# Patient Record
Sex: Male | Born: 1955 | Race: White | Hispanic: No | Marital: Single | Smoking: Never smoker
Health system: Southern US, Community
[De-identification: ages and names within clinical notes are randomized; demographics above are authoritative.]

## PROBLEM LIST (undated history)

## (undated) DIAGNOSIS — G4730 Sleep apnea, unspecified: Secondary | ICD-10-CM

## (undated) DIAGNOSIS — G3184 Mild cognitive impairment, so stated: Secondary | ICD-10-CM

## (undated) DIAGNOSIS — Z860100 Personal history of colon polyps, unspecified: Secondary | ICD-10-CM

## (undated) DIAGNOSIS — J300 Vasomotor rhinitis: Secondary | ICD-10-CM

## (undated) DIAGNOSIS — I823 Embolism and thrombosis of renal vein: Secondary | ICD-10-CM

## (undated) DIAGNOSIS — D126 Benign neoplasm of colon, unspecified: Secondary | ICD-10-CM

## (undated) DIAGNOSIS — M47812 Spondylosis without myelopathy or radiculopathy, cervical region: Secondary | ICD-10-CM

## (undated) DIAGNOSIS — I498 Other specified cardiac arrhythmias: Secondary | ICD-10-CM

## (undated) DIAGNOSIS — M5126 Other intervertebral disc displacement, lumbar region: Secondary | ICD-10-CM

## (undated) DIAGNOSIS — G43909 Migraine, unspecified, not intractable, without status migrainosus: Secondary | ICD-10-CM

## (undated) DIAGNOSIS — F09 Unspecified mental disorder due to known physiological condition: Secondary | ICD-10-CM

## (undated) DIAGNOSIS — E7800 Pure hypercholesterolemia, unspecified: Secondary | ICD-10-CM

## (undated) DIAGNOSIS — J309 Allergic rhinitis, unspecified: Secondary | ICD-10-CM

## (undated) DIAGNOSIS — K529 Noninfective gastroenteritis and colitis, unspecified: Secondary | ICD-10-CM

## (undated) DIAGNOSIS — L718 Other rosacea: Secondary | ICD-10-CM

## (undated) DIAGNOSIS — Z8669 Personal history of other diseases of the nervous system and sense organs: Secondary | ICD-10-CM

## (undated) DIAGNOSIS — M419 Scoliosis, unspecified: Secondary | ICD-10-CM

## (undated) DIAGNOSIS — K219 Gastro-esophageal reflux disease without esophagitis: Secondary | ICD-10-CM

## (undated) HISTORY — DX: Other intervertebral disc displacement, lumbar region: M51.26

## (undated) HISTORY — DX: Pure hypercholesterolemia, unspecified: E78.00

## (undated) HISTORY — DX: Gastro-esophageal reflux disease without esophagitis: K21.9

## (undated) HISTORY — DX: Personal history of other diseases of the nervous system and sense organs: Z86.69

## (undated) HISTORY — DX: Unspecified mental disorder due to known physiological condition: F09

## (undated) HISTORY — DX: Other rosacea: L71.8

## (undated) HISTORY — PX: ANTERIOR CRUCIATE LIGAMENT REPAIR: SHX115

## (undated) HISTORY — DX: Scoliosis, unspecified: M41.9

## (undated) HISTORY — DX: Embolism and thrombosis of renal vein: I82.3

## (undated) HISTORY — PX: ELBOW SURGERY: SHX618

## (undated) HISTORY — DX: Benign neoplasm of colon, unspecified: D12.6

## (undated) HISTORY — DX: Sleep apnea, unspecified: G47.30

## (undated) HISTORY — DX: Spondylosis without myelopathy or radiculopathy, cervical region: M47.812

## (undated) HISTORY — DX: Migraine, unspecified, not intractable, without status migrainosus: G43.909

## (undated) HISTORY — DX: Allergic rhinitis, unspecified: J30.9

## (undated) HISTORY — DX: Other specified cardiac arrhythmias: I49.8

## (undated) HISTORY — DX: Vasomotor rhinitis: J30.0

## (undated) HISTORY — DX: Noninfective gastroenteritis and colitis, unspecified: K52.9

## (undated) HISTORY — DX: Personal history of colon polyps, unspecified: Z86.0100

## (undated) HISTORY — DX: Personal history of colonic polyps: Z86.010

## (undated) HISTORY — DX: Mild cognitive impairment of uncertain or unknown etiology: G31.84

---

## 1988-09-11 HISTORY — PX: ANTERIOR CRUCIATE LIGAMENT REPAIR: SHX115

## 1988-09-11 HISTORY — PX: MOHS SURGERY: SUR867

## 1993-09-11 HISTORY — PX: OTHER SURGICAL HISTORY: SHX169

## 2000-01-17 ENCOUNTER — Ambulatory Visit (HOSPITAL_BASED_OUTPATIENT_CLINIC_OR_DEPARTMENT_OTHER): Admission: RE | Admit: 2000-01-17 | Discharge: 2000-01-17 | Payer: Self-pay | Admitting: Orthopedic Surgery

## 2000-01-17 ENCOUNTER — Encounter (INDEPENDENT_AMBULATORY_CARE_PROVIDER_SITE_OTHER): Payer: Self-pay | Admitting: *Deleted

## 2000-06-22 ENCOUNTER — Ambulatory Visit (HOSPITAL_COMMUNITY): Admission: RE | Admit: 2000-06-22 | Discharge: 2000-06-22 | Payer: Self-pay | Admitting: Gastroenterology

## 2001-06-18 ENCOUNTER — Encounter: Payer: Self-pay | Admitting: Orthopedic Surgery

## 2001-06-18 ENCOUNTER — Ambulatory Visit (HOSPITAL_COMMUNITY): Admission: RE | Admit: 2001-06-18 | Discharge: 2001-06-18 | Payer: Self-pay | Admitting: Orthopedic Surgery

## 2001-08-30 ENCOUNTER — Ambulatory Visit (HOSPITAL_COMMUNITY): Admission: RE | Admit: 2001-08-30 | Discharge: 2001-08-30 | Payer: Self-pay | Admitting: Gastroenterology

## 2004-02-02 ENCOUNTER — Encounter: Admission: RE | Admit: 2004-02-02 | Discharge: 2004-02-02 | Payer: Self-pay | Admitting: Otolaryngology

## 2005-06-30 ENCOUNTER — Ambulatory Visit (HOSPITAL_COMMUNITY): Admission: RE | Admit: 2005-06-30 | Discharge: 2005-06-30 | Payer: Self-pay | Admitting: Orthopedic Surgery

## 2008-09-11 DIAGNOSIS — I2690 Septic pulmonary embolism without acute cor pulmonale: Secondary | ICD-10-CM

## 2008-09-11 DIAGNOSIS — A0472 Enterocolitis due to Clostridium difficile, not specified as recurrent: Secondary | ICD-10-CM

## 2008-09-11 HISTORY — DX: Enterocolitis due to Clostridium difficile, not specified as recurrent: A04.72

## 2008-09-11 HISTORY — DX: Septic pulmonary embolism without acute cor pulmonale: I26.90

## 2008-12-22 ENCOUNTER — Encounter: Admission: RE | Admit: 2008-12-22 | Discharge: 2008-12-22 | Payer: Self-pay | Admitting: Family Medicine

## 2008-12-22 ENCOUNTER — Inpatient Hospital Stay (HOSPITAL_COMMUNITY): Admission: EM | Admit: 2008-12-22 | Discharge: 2008-12-25 | Payer: Self-pay | Admitting: Internal Medicine

## 2008-12-25 ENCOUNTER — Ambulatory Visit: Payer: Self-pay | Admitting: Oncology

## 2008-12-30 ENCOUNTER — Encounter: Payer: Self-pay | Admitting: Internal Medicine

## 2009-01-11 ENCOUNTER — Encounter: Payer: Self-pay | Admitting: Infectious Disease

## 2009-01-12 ENCOUNTER — Encounter: Admission: RE | Admit: 2009-01-12 | Discharge: 2009-01-12 | Payer: Self-pay | Admitting: Infectious Disease

## 2009-01-12 ENCOUNTER — Telehealth: Payer: Self-pay | Admitting: Infectious Disease

## 2009-01-12 ENCOUNTER — Ambulatory Visit: Payer: Self-pay | Admitting: Internal Medicine

## 2009-01-12 DIAGNOSIS — R0609 Other forms of dyspnea: Secondary | ICD-10-CM | POA: Insufficient documentation

## 2009-01-12 DIAGNOSIS — R0989 Other specified symptoms and signs involving the circulatory and respiratory systems: Secondary | ICD-10-CM

## 2009-01-12 DIAGNOSIS — I823 Embolism and thrombosis of renal vein: Secondary | ICD-10-CM | POA: Insufficient documentation

## 2009-01-12 DIAGNOSIS — J984 Other disorders of lung: Secondary | ICD-10-CM | POA: Insufficient documentation

## 2009-01-14 ENCOUNTER — Encounter: Payer: Self-pay | Admitting: Infectious Disease

## 2009-01-15 ENCOUNTER — Ambulatory Visit: Payer: Self-pay | Admitting: Internal Medicine

## 2009-01-15 ENCOUNTER — Ambulatory Visit: Payer: Self-pay | Admitting: Hematology and Oncology

## 2009-01-15 ENCOUNTER — Inpatient Hospital Stay (HOSPITAL_COMMUNITY): Admission: EM | Admit: 2009-01-15 | Discharge: 2009-01-16 | Payer: Self-pay | Admitting: Internal Medicine

## 2009-01-15 ENCOUNTER — Encounter: Payer: Self-pay | Admitting: Infectious Disease

## 2009-01-16 ENCOUNTER — Ambulatory Visit: Payer: Self-pay | Admitting: Internal Medicine

## 2009-01-16 ENCOUNTER — Encounter: Payer: Self-pay | Admitting: Infectious Disease

## 2009-01-18 ENCOUNTER — Encounter: Payer: Self-pay | Admitting: Infectious Disease

## 2009-01-18 LAB — CBC WITH DIFFERENTIAL/PLATELET
BASO%: 0.1 % (ref 0.0–2.0)
Basophils Absolute: 0 10*3/uL (ref 0.0–0.1)
EOS%: 0.7 % (ref 0.0–7.0)
Eosinophils Absolute: 0.2 10*3/uL (ref 0.0–0.5)
HCT: 39.4 % (ref 38.4–49.9)
HGB: 13.5 g/dL (ref 13.0–17.1)
LYMPH%: 7.9 % — ABNORMAL LOW (ref 14.0–49.0)
MCH: 30.7 pg (ref 27.2–33.4)
MCHC: 34.3 g/dL (ref 32.0–36.0)
MCV: 89.6 fL (ref 79.3–98.0)
MONO#: 1.9 10*3/uL — ABNORMAL HIGH (ref 0.1–0.9)
MONO%: 6.7 % (ref 0.0–14.0)
NEUT#: 23.7 10*3/uL — ABNORMAL HIGH (ref 1.5–6.5)
NEUT%: 84.6 % — ABNORMAL HIGH (ref 39.0–75.0)
Platelets: 248 10*3/uL (ref 140–400)
RBC: 4.4 10*6/uL (ref 4.20–5.82)
RDW: 13.5 % (ref 11.0–14.6)
WBC: 28 10*3/uL — ABNORMAL HIGH (ref 4.0–10.3)
lymph#: 2.2 10*3/uL (ref 0.9–3.3)

## 2009-01-18 LAB — MORPHOLOGY: PLT EST: ADEQUATE

## 2009-01-18 LAB — CHCC SMEAR

## 2009-01-21 ENCOUNTER — Ambulatory Visit: Payer: Self-pay | Admitting: Infectious Disease

## 2009-01-21 DIAGNOSIS — N12 Tubulo-interstitial nephritis, not specified as acute or chronic: Secondary | ICD-10-CM | POA: Insufficient documentation

## 2009-01-21 DIAGNOSIS — D702 Other drug-induced agranulocytosis: Secondary | ICD-10-CM | POA: Insufficient documentation

## 2009-01-21 DIAGNOSIS — A0472 Enterocolitis due to Clostridium difficile, not specified as recurrent: Secondary | ICD-10-CM | POA: Insufficient documentation

## 2009-01-21 DIAGNOSIS — B3781 Candidal esophagitis: Secondary | ICD-10-CM | POA: Insufficient documentation

## 2009-01-21 LAB — CBC WITH DIFFERENTIAL/PLATELET
BASO%: 0.5 % (ref 0.0–2.0)
Basophils Absolute: 0 10*3/uL (ref 0.0–0.1)
EOS%: 4.2 % (ref 0.0–7.0)
Eosinophils Absolute: 0.3 10*3/uL (ref 0.0–0.5)
HCT: 38.5 % (ref 38.4–49.9)
HGB: 13.4 g/dL (ref 13.0–17.1)
LYMPH%: 27 % (ref 14.0–49.0)
MCH: 30.9 pg (ref 27.2–33.4)
MCHC: 34.7 g/dL (ref 32.0–36.0)
MCV: 89 fL (ref 79.3–98.0)
MONO#: 0.8 10*3/uL (ref 0.1–0.9)
MONO%: 11.8 % (ref 0.0–14.0)
NEUT#: 3.8 10*3/uL (ref 1.5–6.5)
NEUT%: 56.5 % (ref 39.0–75.0)
Platelets: 192 10*3/uL (ref 140–400)
RBC: 4.33 10*6/uL (ref 4.20–5.82)
RDW: 13.3 % (ref 11.0–14.6)
WBC: 6.6 10*3/uL (ref 4.0–10.3)
lymph#: 1.8 10*3/uL (ref 0.9–3.3)

## 2009-01-21 LAB — MORPHOLOGY: PLT EST: ADEQUATE

## 2009-01-21 LAB — CHCC SMEAR

## 2009-02-04 ENCOUNTER — Ambulatory Visit: Payer: Self-pay | Admitting: Infectious Disease

## 2009-06-16 ENCOUNTER — Encounter: Admission: RE | Admit: 2009-06-16 | Discharge: 2009-06-16 | Payer: Self-pay | Admitting: Internal Medicine

## 2010-01-17 IMAGING — CT CT ANGIO CHEST
2 of 6 series · 17 of 36 positions shown · IV contrast (CONTRAST)
Comparison: Chest and abdominal CTs 12/22/2008.

CLINICAL DATA: Renal vein thrombosis with increase shortness of
breath.  Question pulmonary embolism.

CT ANGIOGRAPHY CHEST WITH CONTRAST
TECHNIQUE: Multidetector CT imaging of the chest was performed
using the standard protocol during bolus administration of
intravenous contrast. Multiplanar CT image reconstructions
including MIPs were obtained to evaluate the vascular anatomy.
Contrast: 120 ml 4mnipaque-YTT intravenously.

[Series 7: thins pacs · axial · 0.71mm/px · z∈[+1364,+1648]mm · 16 of 231 slices shown]
[im 14/231  lung]
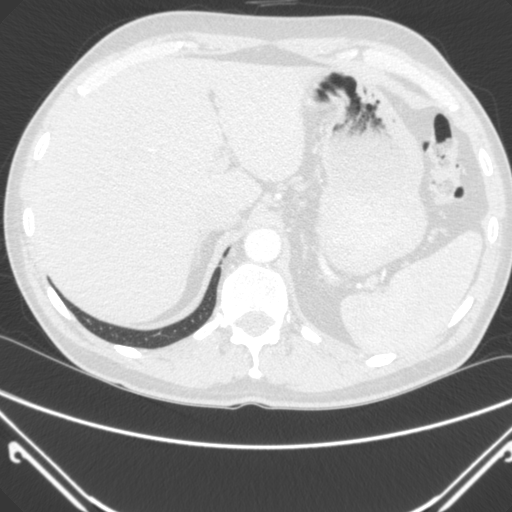
[im 28/231  mediastinal]
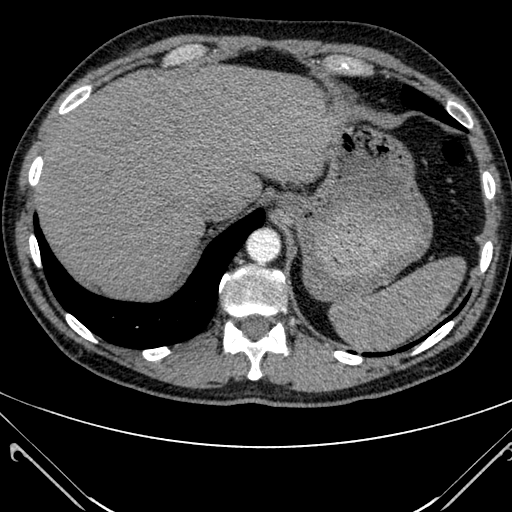
[im 41/231  lung]
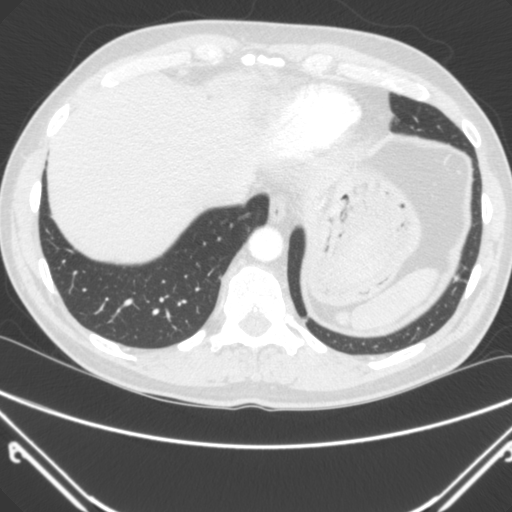
[im 55/231  mediastinal]
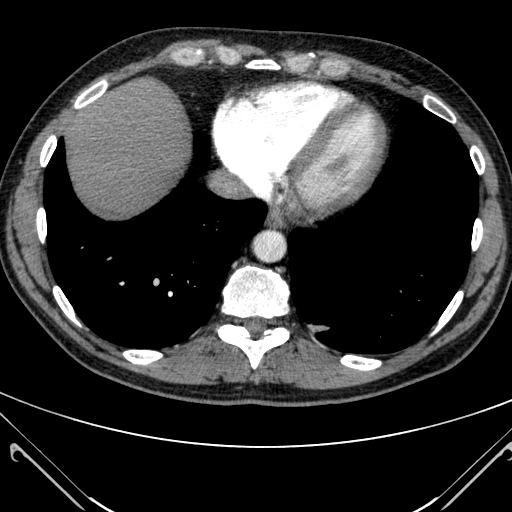
[im 68/231  lung]
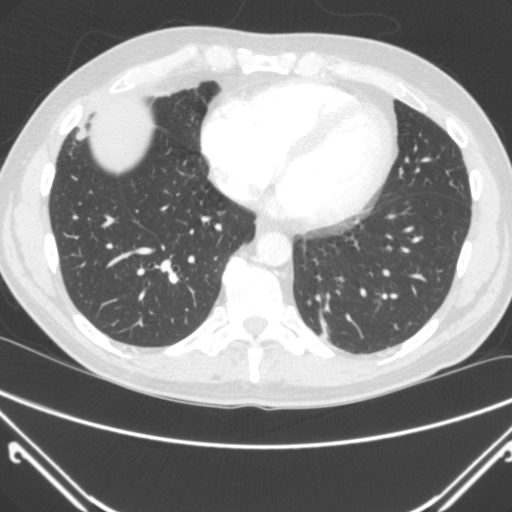
[im 82/231  mediastinal]
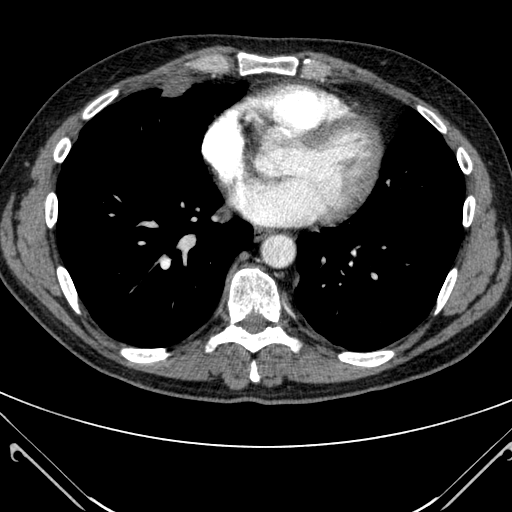
[im 95/231  lung]
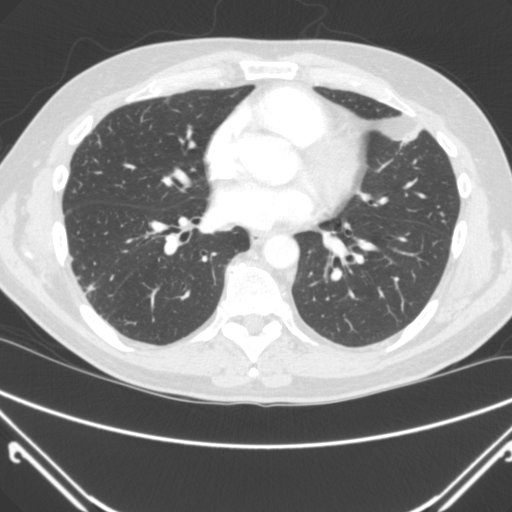
[im 109/231  mediastinal]
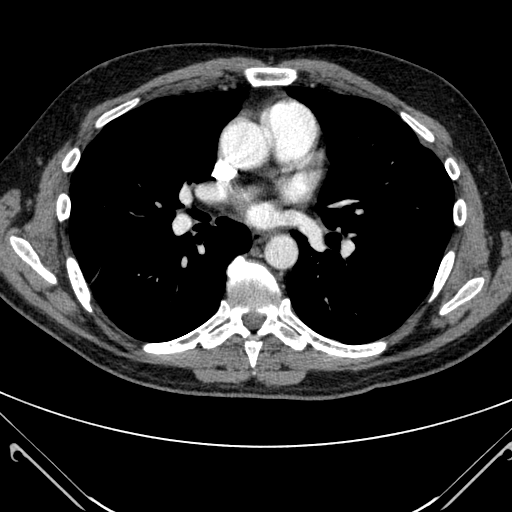
[im 122/231  lung]
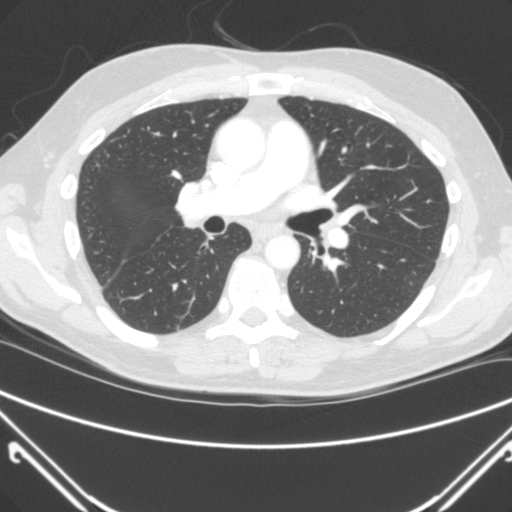
[im 136/231  mediastinal]
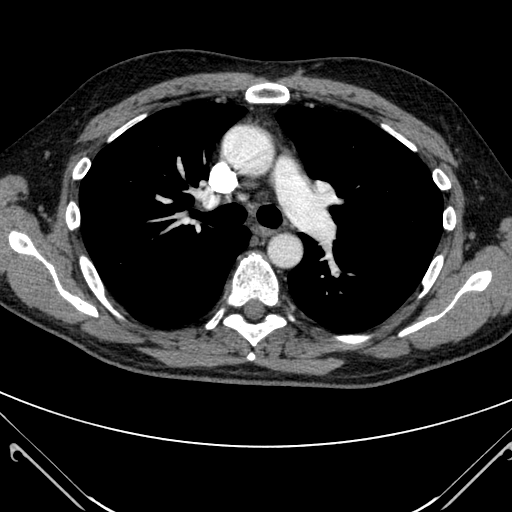
[im 149/231  lung]
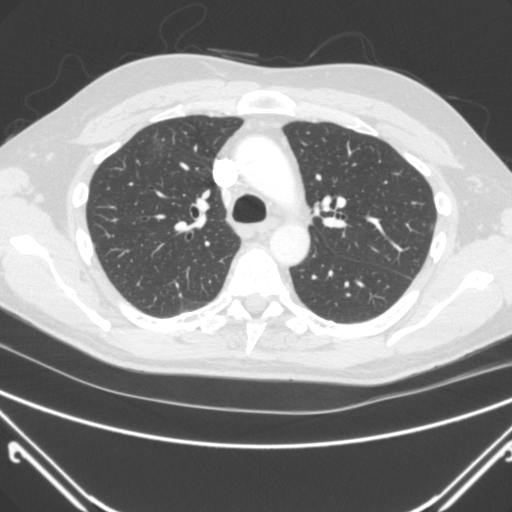
[im 163/231  mediastinal]
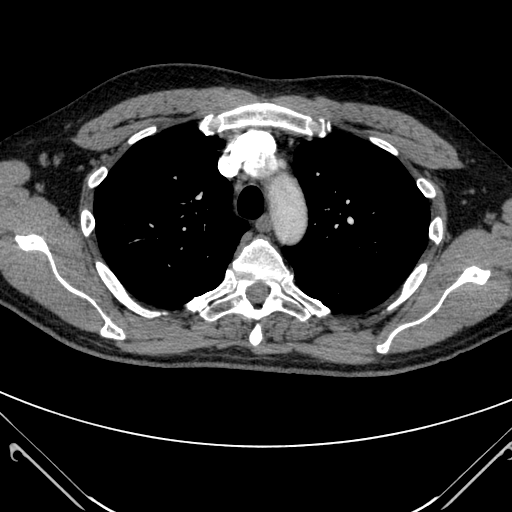
[im 176/231  lung]
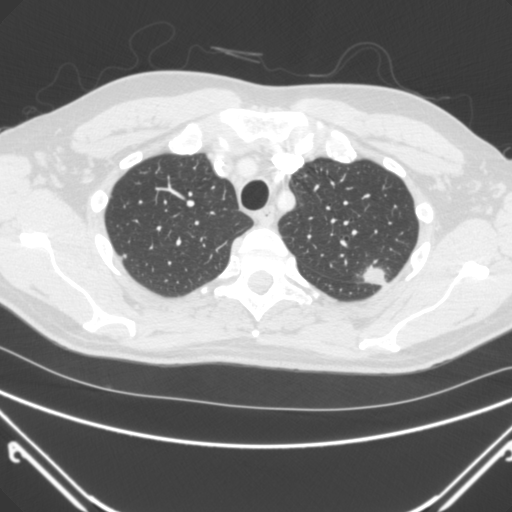
[im 190/231  mediastinal]
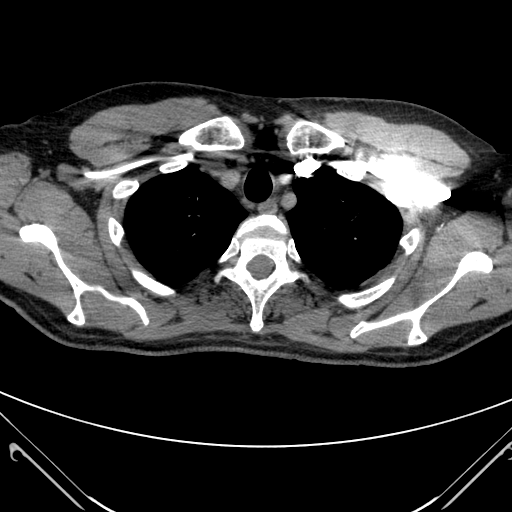
[im 203/231  lung]
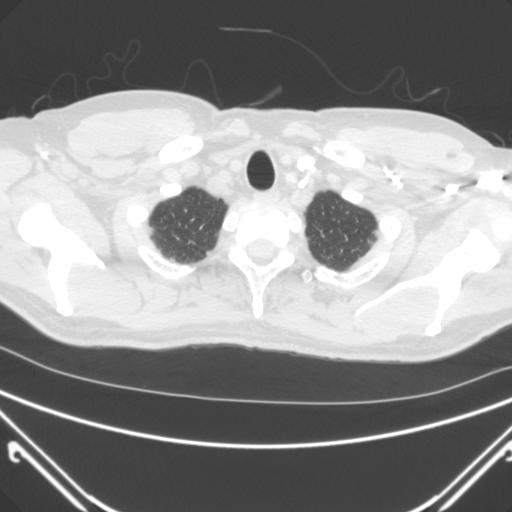
[im 217/231  mediastinal]
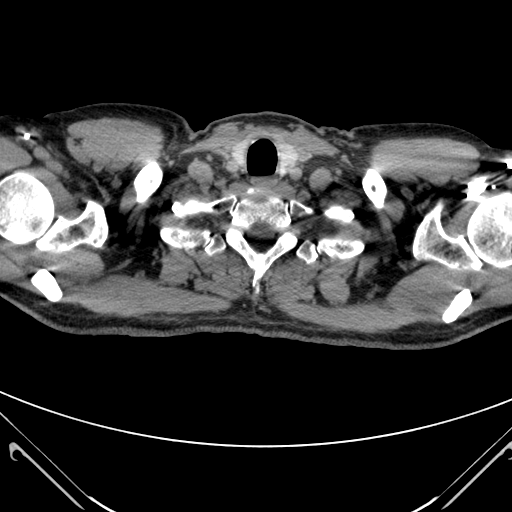

[coronals · coronal · 0.71mm/px · 1 of 154 slices shown]
[im 77/154  mediastinal]
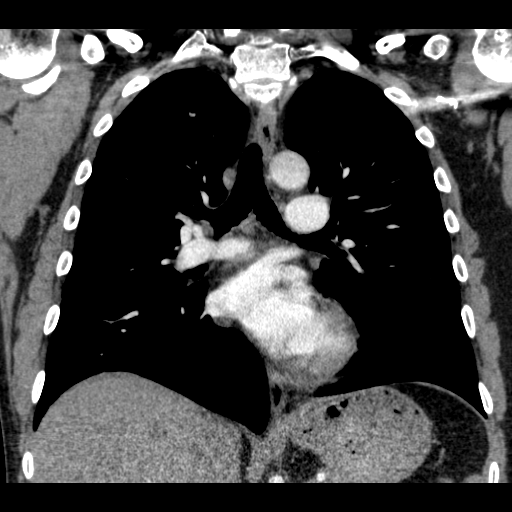

[17 of 36 positions shown; findings below may reference images not displayed]

FINDINGS: The pulmonary arteries are well opacified with contrast.
There is no evidence of acute pulmonary embolism.  The thoracic
aorta appears normal.  There are no enlarged mediastinal or hilar
lymph nodes.  There is no pleural or pericardial effusion.  Right
arm PICC extends to the SVC right atrial junction.

The multiple cavitary pulmonary nodules identified on the prior
examination have improved.  A previously identified cavitary 2.4 cm
left upper lobe nodule now measures 1.3 x 1.6 cm on image 26 and is
no longer cavitary.  A previously demonstrated 14 mm cavitary right
upper lobe nodule now measures 6 mm on image 37 and is no longer
cavitary.  A 2.6-cm lingular nodule on the prior study now measures
1.7 cm on image 61.  Right middle lobe aeration has improved.  No
enlarging pulmonary nodules are identified.

The imaged upper abdomen appears unremarkable.  The left kidney and
left renal vein are not imaged.

Review of the MIP images confirms the above findings.
IMPRESSION: 1.  No evidence of acute pulmonary embolism.
2.  Interval improvement in multiple cavitary pulmonary nodules
bilaterally compatible with treated septic emboli.
3.  No demonstrated acute findings.

The findings were reviewed with Dr. Marcelis following the
examination.  Findings were also discussed by telephone with Dr.
Sarpei Soldiers.

## 2010-10-03 ENCOUNTER — Encounter: Payer: Self-pay | Admitting: Infectious Disease

## 2010-10-11 NOTE — Progress Notes (Signed)
Summary: PTS CTA BETTER ANC  200  Phone Note Outgoing Call   Call placed by: Acey Lav MD,  Jan 12, 2009 12:19 PM Details for Reason: pt sp CTA and labs Summary of Call: CTA done today showed no PE, and shrinkageof pulmonary nodules. Unfortunately his neutropenia has worsened on the vancomycin to an anc of 200. I am dcing his vancomycin and swithcing him to linezolid. He can complete linezolid and levaquin to finish out his course of parenteral therapy and we can dispense with the IV. Initial call taken by: Acey Lav MD,  Jan 12, 2009 12:21 PM    New/Updated Medications: ZYVOX 600 MG TABS (LINEZOLID) Take 1 tablet by mouth two times a day until seen by Dr. Daiva Eves LEVAQUIN 750 MG TABS (LEVOFLOXACIN) Take 1 tablet by mouth once a day until seen by Dr. Daiva Eves PYRIDOXINE HCL 50 MG TABS (PYRIDOXINE HCL) Take 1 tablet by mouth once a day while taking the zyvox   Prescriptions: LEVAQUIN 750 MG TABS (LEVOFLOXACIN) Take 1 tablet by mouth once a day until seen by Dr. Daiva Eves  #10 x 3   Entered and Authorized by:   Acey Lav MD   Signed by:   Paulette Blanch Dam MD on 01/12/2009   Method used:   Electronically to        [id number] (retail)       [address]       [place], [place]  [address]       Ph: [phone] or [phone]       Fax: [phone]   RxID:   [id number] ZYVOX 600 MG TABS (LINEZOLID) Take 1 tablet by mouth two times a day until seen by Dr. Daiva Eves  #20 x 1   Entered and Authorized by:   Acey Lav MD   Signed by:   Paulette Blanch Dam MD on 01/12/2009   Method used:   Electronically to        [facility] (retail)       [address]       [place], [place]  [address]       Ph: [phone] or [phone]       Fax: [phone]   RxID:   [id number] PYRIDOXINE HCL 50 MG TABS (PYRIDOXINE HCL) Take 1 tablet by mouth once a day while taking the zyvox  #10 x 0   Entered and Authorized by:   Acey Lav MD   Signed by:    Paulette Blanch Dam MD on 01/12/2009   Method used:   Electronically to        [facility] (retail)       [address]       [place], [place]  [address]       Ph: [phone] or [phone]       Fax: [phone]   RxID:   [id number] LEVAQUIN 750 MG TABS (LEVOFLOXACIN) Take 1 tablet by mouth once a day until seen by Dr. Daiva Eves  #10 x 3   Entered and Authorized by:   Acey Lav MD   Signed by:   Paulette Blanch Dam MD on 01/12/2009   Method used:   Electronically to        [facility] [address] (retail)       [address]       [place],  [place]  [address]       Ph: [phone]       Fax: [phone]   RxID:   [id number] ZYVOX 600 MG TABS (LINEZOLID) Take 1 tablet by mouth two times a day until seen by Dr. Daiva Eves  #20 x 1   Entered and Authorized by:   Acey Lav MD   Signed by:   Paulette Blanch Dam MD on 01/12/2009   Method used:   Electronically to        [facility] [address] (retail)       [address]       [place], [place]  [address]       Ph: [phone]       Fax: [phone]   RxID:   [id number]

## 2010-10-11 NOTE — Miscellaneous (Signed)
Summary: [facility]: Orders  [facility]: Orders   Imported By: Florinda Marker 01/26/2009 14:25:30  _____________________________________________________________________  External Attachment:    Type:   Image     Comment:   External Document

## 2010-10-11 NOTE — Miscellaneous (Signed)
Summary: HIPAA Restrictions  HIPAA Restrictions   Imported By: Florinda Marker 01/21/2009 14:21:14  _____________________________________________________________________  External Attachment:    Type:   Image     Comment:   External Document

## 2010-10-11 NOTE — Assessment & Plan Note (Signed)
Summary: hsfu need chrt/ok per kvd/kam   Primary Provider:  Dr. Mallie Mussel  CC:  f/u ov 2. esophagitis pain started last week after leukopenia 3. most recent d/c Saturday .  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 Please see my consult note from that admission for details. Briefly he developed fevers, chills on Easter Sunday, did urine dipstick whcih was positive for nitrites, LE, (but did not do culture), self rx levaquin 750mg , continue to feel unwell, saw Dr.McDiarmid who did scan showing pyelonephritis. Dr. Levonne Lapping got urine culture (which grew nothing but on levaquin) gave pt IM rocephin, gentamicin x 3 days and continued Levaquin. By the following weekend he had continued fevers, now DOE. He had CXR with mx pulmonary nodules. CT chest abd, pelvis showed mutiple cavitary nodules throughout lungs c/w emboli, he also had renal vein thrombosis. He had blood cultures drawn (still on levaquin), TEE was done which was clean. He was ruled out for TB, crypto ag, neg, HIV negative. He had been started on vanco and rocephin and was dc to home. In the interim he again felt unwell with malaise, dyspnea, repeat CT scan  showed nodules regressing in size. In the interim he then developed neutropneia, and rocepihn was stopped, and levaquin subtituted, neutropenia persisted and vanoc seemed likely culprit, and I swapped zyvox in for vancomycin. Neutropenia persisted still and there was smear concerning for blasts. HE was admitted to [facility] over weekend and see by Dr. Dalene Carrow and given several doses of GCSF. He developed diarrhea and cramping abdominal pain and was started on empiric flagyl. IN the interim he also had developed pain with swallowing with concern for candidal esophagiti and started clotrimazole troches. He apparently had repeat labs this MOnday and now has leukocytosis (due to GCSF plus/minus possible C diff--C diff toxin x 1 negative though this is not very good test)>  TOday he feels better, still with loose bm, up to 4 a day but less than he had been having and crmaping is gone. His dyspnea is much better. No fevers now.  Preventive Screening-Counseling & Management     Alcohol drinks/day: 0     Smoking Status: never     Caffeine use/day: 2/wk   Current Allergies (reviewed today): ! 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 c diff    possible candida esophagitis.  Family History:      No history of nephrotic syndrome or hypercoagulable      disorder.         Social History:    The patient is single, lives alone.  He has several      dogs.  None of them have licked any wounds or anything on his body      recently.  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  He has traveled along      the [address] and has once been to [facility] park but      otherwise has not been to the [facility].  He has not been outside      of the country.  His annual PPD test in March was nonreactive.  He  denies recreational drug use including intravenous drug use.  He denies      any risk factors for HIV, although he is willing to be tested for HIV.         Review of Systems       The patient complains of weight loss, dyspnea on exertion, and abdominal pain.  The patient denies anorexia, fever, decreased hearing, hoarseness, chest pain, syncope, peripheral edema, headaches, hemoptysis, melena, and hematochezia.    Vital Signs:  Patient profile:   55 year old male Height:      74 inches (187.96 cm) Weight:      204.1 pounds (44.73 kg) BMI:     26.30 BSA:     2.19 Temp:     97.6 degrees F Pulse rate:   63 / minute BP sitting:   126 / 84  Vitals Entered By:  Tomasita Morrow RN (Jan 21, 2009 9:16 AM) CC: f/u ov 2. esophagitis pain started last week after leukopenia 3. most recent d/c Saturday  Is Patient Diabetic? No Pain Assessment Patient in pain? no      Nutritional Status BMI of 19 -24 = normal Nutritional Status Detail normal   Have you ever been in a relationship where you felt threatened, hurt or afraid?No  Domestic Violence Intervention none  Does patient need assistance? Functional Status Self care Ambulation Normal   Physical Exam  General:  alert and well-developed Head:  normocephalic and atraumatic.   Eyes:  vision grossly intact and pupils equal.   Ears:  no external deformities.   Nose:  no external deformity.   Mouth:  good dentition and pharynx pink and moist.  no thrush visible Lungs:  normal respiratory effort, normal breath sounds, and no crackles.   Heart:  normal rate, regular rhythm, no murmur, no gallop, and no rub.   Abdomen:  soft, non-tender, normal bowel sounds, and no distention.   Msk:  normal ROM.   Extremities:  No clubbing, cyanosis, edema, or deformity noted with normal full range of motion of all joints.   Neurologic:  alert & oriented X3.     Impression & Recommendations:  Problem # 1:  OTHER DISEASES OF LUNG NOT ELSEWHERE CLASSIFIED (ICD-518.89) mx pulmonary emboli, septic vs aseptic due to Renal vein thrombosis. Stop his linezolid and levaquin today as he is now 29 days of antibacterial therapy and not neutropenic. Repeat blood cultures in 2 weeks to make sure if he had bacteremia that it has been cleared Orders: Est. Patient Level IV (99214)Future Orders: T-Culture, Blood Routine ([id number]) ... 02/04/2009 T-Culture, Blood Routine ([id number]) ... 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 Cyndie Chime as well. Workup for cause, at this point points to the pyelonephritis as the cause. ON coumadin. Would likely treat for a year  with anticoagulation.  Will repeat Ct of the abdomen in one more  month.  Orders: Est. Patient Level IV (99214)Future Orders: CT with Contrast (CT w/ contrast) ... 02/22/2009  Problem # 3:  PYELONEPHRITIS (ICD-590.80) Assessment: Improved  resolved, was cause of RVT it appears The following medications were removed from the medication list:    Zyvox 600 Mg Tabs (Linezolid) .Marland Kitchen... Take 1 tablet by mouth two times a day until seen by dr. Zenaida Niece dam    Levaquin 750 Mg Tabs (Levofloxacin) .Marland Kitchen... Take 1 tablet by mouth once a day until seen by dr. Zenaida Niece dam His updated medication list for this problem includes:  Metronidazole 500 Mg Tabs (Metronidazole) ..... One three times a day  Orders: Est. Patient Level IV (19147)  Problem # 4:  NEUTROPENIA, DRUG-INDUCED (ICD-288.03) Assessment: Improved  due to vancomycin it appears. He is following up with Dr. Cyndie Chime. He is now with high wbc secondary to gcsf and possibly cdiff  Orders: Est. Patient Level IV (82956)  Problem # 5:  CANDIDIASIS OF THE ESOPHAGUS (OZH-086.57) Assessment: New  Will give him 2 weeks of therapy with fluconazole  Orders: Est. 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 He is to finish 14 day course of flagyl.   Orders: Est. Patient Level IV (29528)  Medications Added to Medication List This Visit: 1)  Metronidazole 500 Mg Tabs (Metronidazole) .... One three times a day 2)  Coumadin 7.5 Mg Tabs (Warfarin sodium) .... Current daily dose 3)  Mycelex 10 Mg Troc (Clotrimazole) .... Once daily started 01-20-09 4)  Diflucan 100 Mg Tabs (Fluconazole) .... Take two tablets on day one then take 1 tablet by mouth once a day for 13 days  Patient Instructions: 1)  Please come back for blood cultures in 2 weeks (or do this at cancer center) 2)  We will schedule Ct of abdomen in another month 3)  Follouwp with Dr Daiva Eves as  needed Prescriptions: DIFLUCAN 100 MG TABS (FLUCONAZOLE) take two tablets on day one then Take 1 tablet by mouth once a day for 13 days  #15 x 3   Entered and Authorized by:   Acey Lav MD   Signed by:   Paulette Blanch Dam MD on 01/21/2009   Method used:   Print then Give to Patient   RxID:   [id number] DIFLUCAN 100 MG TABS (FLUCONAZOLE) take two tablets on day one then Take 1 tablet by mouth once a day for 13 days  #15 x 1   Entered and Authorized by:   Acey Lav MD   Signed by:   Paulette Blanch Dam MD on 01/21/2009   Method used:   Electronically to        [facility] [address] (retail)       [address]       [place], [place]  [address]       Ph: [phone]       Fax: [phone]   RxID:   [id number]

## 2010-10-11 NOTE — Letter (Signed)
Summary: Telephone Note/[facility]  Telephone Note/[facility]   Imported By: Sherian Rein 01/22/2009 13:25:29  _____________________________________________________________________  External Attachment:    Type:   Image     Comment:   External Document

## 2010-10-11 NOTE — Progress Notes (Signed)
Summary: PT NEEDS CT TODAY  Phone Note Call from Patient   Caller: pt dyspnic Summary of Call: Pt with dypsnea. He was becomeing neutropenic last week while on the rocehpine and vancomycin. I changed him to levaquin and vancomcyinc. 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

## 2010-10-11 NOTE — Assessment & Plan Note (Signed)
Summary: Pulmonary/ trial of symbicort 80 for sob   Primary Provider/Referring Provider:  Dr. Mallie Mussel  CC:  Post hospital followup.  Pt c/o increased sob on exertion.  States that this tends to be worse by the end of the day.  Also has some sry cough..  History of Present Illness: 49 yowm never smoker onset llq pain Easter 2010,  tntc rbc's so wu for kidney stones turned up pyelonephritis on left and rx with roc/gent/levaquin > worsening  pleuritic cp bilateral with sob so admit [facility]  4/13 - 12/25/08 with cavitary nodules.  rx as pyelo/rv thrombosis > Septic emboli with neg TEE for veg and ct fu done Jan 12, 2009 showed improvment.  Jan 12, 2009 ov with cc sob after working all day, assoc with dry cough,  no worse sleeping, sob better today afer neb . Pt denies any significant sore throat, nasal congestion or excess secretions, fever, chills, sweats, unintended wt loss, pleuritic or exertional cp, orthopnea pnd or leg swelling.    Current Medications (verified): 1)  Zyvox 600 Mg Tabs (Linezolid) .... Take 1 Tablet By Mouth Two Times A Day Until Seen By Dr. Daiva Eves 2)  Levaquin 750 Mg Tabs (Levofloxacin) .... Take 1 Tablet By Mouth Once A Day Until Seen By Dr. Daiva Eves 3)  Pyridoxine Hcl 50 Mg Tabs (Pyridoxine Hcl) .... Take 1 Tablet By Mouth Once A Day While Taking The Zyvox 4)  Nexium 40 Mg Cpdr (Esomeprazole Magnesium) .Marland Kitchen.. 1 Once Daily 5)  Zyrtec Allergy 10 Mg Tabs (Cetirizine Hcl) .Marland Kitchen.. 1 Once Daily As Needed  Allergies (verified): 1)  ! Vancomycin  Vital Signs:  Patient profile:   55 year old male Height:      74 inches Weight:      207.25 pounds BMI:     26.71 Temp:     98.1 degrees F oral Pulse rate:   75 / minute BP sitting:   100 / 62  (left arm)  Vitals Entered By: Vernie Murders (Jan 12, 2009 2:13 PM)  O2 Sat on room air at rest %:  99  Physical Exam  Additional Exam:   Ambulatory healthy appearing wm in no acute distress. Afeb with normal vital signs HEENT: nl  dentition, turbinates, and orophanx. Nl external ear canals without cough reflex Neck without JVD/Nodes/TM Lungs clear to A and P bilaterally without cough on insp or exp maneuvers ( w/in 4 hours of neb albuterol rx) RRR no s3 or murmur or increase in P2 Abd soft and benign with nl excursion in the supine position. No bruits or organomegaly Ext warm without calf tenderness, cyanosis clubbing or edema Skin warm and dry without lesions     Impression & Recommendations:  Problem # 1:  DYSPNEA (ICD-786.09)  Trial of symbicort 80 and f/u pft's as needed.  His updated medication list for this problem includes:    Symbicort 80-4.5 Mcg/act Aero (Budesonide-formoterol fumarate) .Marland Kitchen... 2 puffs first thing  in am and 2 puffs again in pm about 12 hours later  Orders: No Charge Patient Arrived (NCPA0) (NCPA0)  Medications Added to Medication List This Visit: 1)  Pyridoxine Hcl 50 Mg Tabs (Pyridoxine hcl) .... Take 1 tablet by mouth once a day while taking the zyvox 2)  Nexium 40 Mg Cpdr (Esomeprazole magnesium) .Marland Kitchen.. 1 once daily 3)  Zyrtec Allergy 10 Mg Tabs (Cetirizine hcl) .Marland Kitchen.. 1 once daily as needed 4)  Symbicort 80-4.5 Mcg/act Aero (Budesonide-formoterol fumarate) .... 2 puffs  first thing  in am and 2 puffs again in pm about 12 hours later  Complete Medication List: 1)  Zyvox 600 Mg Tabs (Linezolid) .... Take 1 tablet by mouth two times a day until seen by dr. Zenaida Niece dam 2)  Levaquin 750 Mg Tabs (Levofloxacin) .... Take 1 tablet by mouth once a day until seen by dr. Zenaida Niece dam 3)  Pyridoxine Hcl 50 Mg Tabs (Pyridoxine hcl) .... Take 1 tablet by mouth once a day while taking the zyvox 4)  Nexium 40 Mg Cpdr (Esomeprazole magnesium) .Marland Kitchen.. 1 once daily 5)  Zyrtec Allergy 10 Mg Tabs (Cetirizine hcl) .Marland Kitchen.. 1 once daily as needed 6)  Symbicort 80-4.5 Mcg/act Aero (Budesonide-formoterol fumarate) .... 2 puffs first thing  in am and 2 puffs again in pm about 12 hours later  Patient Instructions: 1)  GERD  (gastroesophageal reflux disease) was discussed. It is a common cause of respiratory symptoms. It commonly presents in the absence of heartburn. GERD can be treated with medication, but also with lifestyle changes including avoidance of late meals, excessive alcohol, smoking cessation, and avoid fatty foods, chocolate, peppermint, colas, red wine, and acidic juices such as orange juice. NO MINT OR MENTHOL PRODUCTS(no cough drops) 2)  USE SUGARLESS CANDY INSTEAD (jolley ranchers)  3)  Symbicort 80 2 puffs first thing  in am and 2 puffs again in pm about 12 hours later  Prescriptions: SYMBICORT 80-4.5 MCG/ACT  AERO (BUDESONIDE-FORMOTEROL FUMARATE) 2 puffs first thing  in am and 2 puffs again in pm about 12 hours later  #1 x 11   Entered and Authorized by:   Nyoka Cowden MD   Signed by:   Nyoka Cowden MD on 01/12/2009   Method used:   Print then Give to Patient   RxID:   [id number]

## 2010-12-20 LAB — CULTURE, BLOOD (ROUTINE X 2)
Culture: NO GROWTH
Culture: NO GROWTH

## 2010-12-20 LAB — COMPREHENSIVE METABOLIC PANEL
ALT: 35 U/L (ref 0–53)
AST: 29 U/L (ref 0–37)
Albumin: 3 g/dL — ABNORMAL LOW (ref 3.5–5.2)
Alkaline Phosphatase: 109 U/L (ref 39–117)
BUN: 16 mg/dL (ref 6–23)
CO2: 27 mEq/L (ref 19–32)
Calcium: 8.5 mg/dL (ref 8.4–10.5)
Chloride: 106 mEq/L (ref 96–112)
Creatinine, Ser: 0.91 mg/dL (ref 0.4–1.5)
GFR calc Af Amer: >60 mL/min (ref >60)
GFR calc non Af Amer: >60 mL/min (ref >60)
Glucose, Bld: 90 mg/dL (ref 70–99)
Potassium: 3.8 mEq/L (ref 3.5–5.1)
Sodium: 138 mEq/L (ref 135–145)
Total Bilirubin: 0.5 mg/dL (ref 0.3–1.2)
Total Protein: 6.2 g/dL (ref 6.0–8.3)

## 2010-12-20 LAB — PATHOLOGIST SMEAR REVIEW

## 2010-12-20 LAB — CBC
HCT: 35.2 % — ABNORMAL LOW (ref 39.0–52.0)
HCT: 37.7 % — ABNORMAL LOW (ref 39.0–52.0)
Hemoglobin: 12.4 g/dL — ABNORMAL LOW (ref 13.0–17.0)
Hemoglobin: 13.4 g/dL (ref 13.0–17.0)
MCHC: 35.2 g/dL (ref 30.0–36.0)
MCHC: 35.7 g/dL (ref 30.0–36.0)
MCV: 89.4 fL (ref 78.0–100.0)
MCV: 89.5 fL (ref 78.0–100.0)
Platelets: 250 10*3/uL (ref 150–400)
Platelets: 285 10*3/uL (ref 150–400)
RBC: 3.94 MIL/uL — ABNORMAL LOW (ref 4.22–5.81)
RBC: 4.22 MIL/uL (ref 4.22–5.81)
RDW: 13.1 % (ref 11.5–15.5)
RDW: 13.3 % (ref 11.5–15.5)
WBC: 2.7 10*3/uL — ABNORMAL LOW (ref 4.0–10.5)
WBC: 3.7 10*3/uL — ABNORMAL LOW (ref 4.0–10.5)

## 2010-12-20 LAB — DIFFERENTIAL
Basophils Absolute: 0 10*3/uL (ref 0.0–0.1)
Basophils Absolute: 0.1 10*3/uL (ref 0.0–0.1)
Basophils Relative: 1 % (ref 0–1)
Basophils Relative: 2 % — ABNORMAL HIGH (ref 0–1)
Eosinophils Absolute: 0.2 10*3/uL (ref 0.0–0.7)
Eosinophils Absolute: 0.3 10*3/uL (ref 0.0–0.7)
Eosinophils Relative: 7 % — ABNORMAL HIGH (ref 0–5)
Eosinophils Relative: 8 % — ABNORMAL HIGH (ref 0–5)
Lymphocytes Relative: 62 % — ABNORMAL HIGH (ref 12–46)
Lymphocytes Relative: 62 % — ABNORMAL HIGH (ref 12–46)
Lymphs Abs: 1.6 10*3/uL (ref 0.7–4.0)
Lymphs Abs: 2.3 10*3/uL (ref 0.7–4.0)
Monocytes Absolute: 0.7 10*3/uL (ref 0.1–1.0)
Monocytes Absolute: 0.9 10*3/uL (ref 0.1–1.0)
Monocytes Relative: 24 % — ABNORMAL HIGH (ref 3–12)
Monocytes Relative: 27 % — ABNORMAL HIGH (ref 3–12)
Neutro Abs: 0.1 10*3/uL — ABNORMAL LOW (ref 1.7–7.7)
Neutro Abs: 0.2 10*3/uL — ABNORMAL LOW (ref 1.7–7.7)
Neutrophils Relative %: 2 % — ABNORMAL LOW (ref 43–77)
Neutrophils Relative %: 5 % — ABNORMAL LOW (ref 43–77)

## 2010-12-20 LAB — CLOSTRIDIUM DIFFICILE EIA: C difficile Toxins A+B, EIA: NEGATIVE

## 2010-12-20 LAB — PROTIME-INR
INR: 2.1 — ABNORMAL HIGH (ref 0.00–1.49)
Prothrombin Time: 24.7 seconds — ABNORMAL HIGH (ref 11.6–15.2)

## 2010-12-20 LAB — LACTATE DEHYDROGENASE: LDH: 164 U/L (ref 94–250)

## 2010-12-20 LAB — ANTI-NEUTROPHIL ANTIBODY

## 2010-12-20 LAB — MPO/PR-3 (ANCA) ANTIBODIES

## 2010-12-21 LAB — PROTIME-INR
INR: 1.4 (ref 0.00–1.49)
INR: 1.3 (ref 0.00–1.49)
INR: 1.3 (ref 0.00–1.49)
Prothrombin Time: 17.7 seconds — ABNORMAL HIGH (ref 11.6–15.2)
Prothrombin Time: 16.6 seconds — ABNORMAL HIGH (ref 11.6–15.2)
Prothrombin Time: 16.8 seconds — ABNORMAL HIGH (ref 11.6–15.2)

## 2010-12-21 LAB — HEPARIN LEVEL (UNFRACTIONATED)
Heparin Unfractionated: 0.13 IU/mL — ABNORMAL LOW (ref 0.30–0.70)
Heparin Unfractionated: 0.25 IU/mL — ABNORMAL LOW (ref 0.30–0.70)
Heparin Unfractionated: 0.41 IU/mL (ref 0.30–0.70)
Heparin Unfractionated: 1.07 IU/mL — ABNORMAL HIGH (ref 0.30–0.70)

## 2010-12-21 LAB — CULTURE, BLOOD (ROUTINE X 2)
Culture: NO GROWTH
Culture: NO GROWTH

## 2010-12-21 LAB — APTT: aPTT: 35 seconds (ref 24–37)

## 2010-12-21 LAB — BASIC METABOLIC PANEL
BUN: 12 mg/dL (ref 6–23)
CO2: 29 mEq/L (ref 19–32)
Calcium: 8.8 mg/dL (ref 8.4–10.5)
Chloride: 97 mEq/L (ref 96–112)
Creatinine, Ser: 0.89 mg/dL (ref 0.4–1.5)
GFR calc Af Amer: >60 mL/min (ref >60)
GFR calc non Af Amer: >60 mL/min (ref >60)
Glucose, Bld: 134 mg/dL — ABNORMAL HIGH (ref 70–99)
Potassium: 4 mEq/L (ref 3.5–5.1)
Sodium: 134 mEq/L — ABNORMAL LOW (ref 135–145)

## 2010-12-21 LAB — GENTAMICIN LEVEL, RANDOM: Gentamicin Rm: <0.2 ug/mL

## 2010-12-21 LAB — CBC
HCT: 33 % — ABNORMAL LOW (ref 39.0–52.0)
HCT: 33.2 % — ABNORMAL LOW (ref 39.0–52.0)
Hemoglobin: 11.3 g/dL — ABNORMAL LOW (ref 13.0–17.0)
Hemoglobin: 11.5 g/dL — ABNORMAL LOW (ref 13.0–17.0)
MCHC: 34.1 g/dL (ref 30.0–36.0)
MCHC: 34.8 g/dL (ref 30.0–36.0)
MCV: 92.9 fL (ref 78.0–100.0)
MCV: 93.3 fL (ref 78.0–100.0)
Platelets: 400 10*3/uL (ref 150–400)
Platelets: 407 10*3/uL — ABNORMAL HIGH (ref 150–400)
RBC: 3.55 MIL/uL — ABNORMAL LOW (ref 4.22–5.81)
RBC: 3.55 MIL/uL — ABNORMAL LOW (ref 4.22–5.81)
RDW: 13.7 % (ref 11.5–15.5)
RDW: 13.9 % (ref 11.5–15.5)
WBC: 8.5 10*3/uL (ref 4.0–10.5)
WBC: 9.8 10*3/uL (ref 4.0–10.5)

## 2010-12-21 LAB — CREATININE, SERUM
Creatinine, Ser: 0.8 mg/dL (ref 0.4–1.5)
GFR calc Af Amer: >60 mL/min (ref >60)
GFR calc non Af Amer: >60 mL/min (ref >60)

## 2010-12-21 LAB — CRYPTOCOCCAL ANTIGEN: Crypto Ag: NEGATIVE

## 2010-12-21 LAB — URINE MICROSCOPIC-ADD ON

## 2010-12-21 LAB — URINALYSIS, ROUTINE W REFLEX MICROSCOPIC
Bilirubin Urine: NEGATIVE
Glucose, UA: NEGATIVE mg/dL
Ketones, ur: NEGATIVE mg/dL
Leukocytes, UA: NEGATIVE
Nitrite: NEGATIVE
Protein, ur: NEGATIVE mg/dL
Specific Gravity, Urine: 1.009 (ref 1.005–1.030)
Urobilinogen, UA: 0.2 mg/dL (ref 0.0–1.0)
pH: 6.5 (ref 5.0–8.0)

## 2010-12-21 LAB — HISTOPLASMA ANTIGEN, URINE
Histoplasma Antigen Urine: NEGATIVE
Histoplasma Antigen, urine: <2 U/mL

## 2010-12-21 LAB — MISCELLANEOUS TEST

## 2010-12-21 LAB — HIV ANTIBODY (ROUTINE TESTING W REFLEX): HIV: NONREACTIVE

## 2010-12-21 LAB — PROTHROMBIN GENE MUTATION

## 2010-12-21 LAB — VANCOMYCIN, TROUGH: Vancomycin Tr: 11.6 ug/mL (ref 10.0–20.0)

## 2010-12-21 LAB — FACTOR 5 LEIDEN

## 2011-01-24 NOTE — Discharge Summary (Signed)
NAME:  Martin [name], Martin [name] NO.:  [id number]   MEDICAL RECORD NO.:  [id number]          PATIENT TYPE:  AMB   LOCATION:  ENDO                         FACILITY:  [facility]   PHYSICIAN:  Theressa Millard, M.D.    DATE OF BIRTH:  [birth year]   DATE OF ADMISSION:  12/23/2008  DATE OF DISCHARGE:  12/23/2008                               DISCHARGE SUMMARY   ADMITTING DIAGNOSIS:  Probable septic pulmonary emboli.   DISCHARGE DIAGNOSES:  1. Probable septic pulmonary emboli.  2. Pyelonephritis.  3. Renal vein thrombosis.  4. History of migraine headaches.   The patient is a 55 year old white male who had developed abdominal  pain, fever and chills on Easter.  He did urinalysis on himself and had  pyuria.  He started himself on Levaquin.  He saw Dr. McDiarmid the next  day and a CT urogram showed a left pyelonephrosis with no stone.  Over  the 4 days prior to admission he developed increasing shortness of  breath, chest x-ray that was very abnormal and his colleagues at [place]  at [facility] sent him in for a chest CT.  He was then referred to  me.   HOSPITAL COURSE:  On admission it was evident that his chest CT was  consistent with multiple pulmonary emboli.  There were peripheral  cavitary lesions.  Differential diagnosis did include cavitary  malignancy but this was not thought to be likely because these were all  peripheral lesions.  In addition this developed in the setting of  pyelonephritis and there was evidence now on a renal vein thrombosis.  Therefore, it was thought that the patient either had aseptic or septic  renal vein thrombosis.  Complete differential diagnosis could not be  made as the patient had already been started on antibiotics.  He was  seen in consultation by Dr. Daiva Eves and also by Dr. Eldridge Dace.  He did  undergo a transesophageal echocardiogram that was negative.  Cultures  remained negative.  The treatment plan was then decided upon as follows.  He  will be treated for culture negative SBE for 4 weeks.  Lovenox will  be given and overlapped with Coumadin and Coumadin will be planned for 6  to 12 months.   DISCHARGE MEDICATIONS:  1. Lovenox 90 mg subcu q.12 hours for 5-6 days until pro time is      adequate.  2. Coumadin 5 mg 2 on the day of discharge and 1 daily after that or      as directed.  3. Vancomycin 1500 mg IV q.12 h to adjust for trough levels of 15-20.  4. Rocephin 2 grams IV every 24 hours through Jan 21, 2009.   FOLLOWUP:  Will be made for a pro time on December 28, 2008 at [place] at  [facility], and will be sent to Dr. Earl Gala.  Followup appointment  with Dr. Daiva Eves on Jan 21, 2009 and followup CT on Jan 20, 2009.   ACTIVITY:  Increase activity slowly.  Return to work as tolerated.   DIET:  No added salt.  Theressa Millard, M.D.  Electronically Signed     JO/MEDQ  D:  01/11/2009  T:  01/11/2009  Job:  [id number]

## 2011-01-24 NOTE — Consult Note (Signed)
NAME:  Martin [name], Martin [name] NO.:  [id number]   MEDICAL RECORD NO.:  [id number]          PATIENT TYPE:  AMB   LOCATION:  ENDO                         FACILITY:  [facility]   PHYSICIAN:  Acey Lav, [name]  DATE OF BIRTH:  23-Oct-1955   DATE OF CONSULTATION:  12/23/2008  DATE OF DISCHARGE:                                 CONSULTATION   Duplicate dicatation      Acey Lav, [name]  Electronically Signed     CV/MEDQ  D:  12/23/2008  T:  12/23/2008  Job:  [id number]   cc:   Martina Sinner, [name]  Fax: [phone]   Charlaine Dalton. Sherene Sires, [name], FCCP  [address]  [place] [address]

## 2011-01-24 NOTE — H&P (Signed)
NAME:  LITTLE MD, Martin [name] NO.:  [id number]   MEDICAL RECORD NO.:  [id number]          PATIENT TYPE:  INP   LOCATION:  1315                         FACILITY:  [facility]   PHYSICIAN:  Martin Furlong, MD      DATE OF BIRTH:  Dec 20, 1955   DATE OF ADMISSION:  01/15/2009  DATE OF DISCHARGE:                              HISTORY & PHYSICAL   PRIMARY CARE PHYSICIAN:  Martin [name], M.D.   CHIEF COMPLAINT:  Blast cells and neutropenia on blood tests.   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  He presented to [facility] as a direct  admission through primary care Martin [name] Dr. Earl Gala today.  He has  significant history of complicated left pyelonephritis with questionable  septic versus aseptic pulmonary emboli and left renal vein thrombosis.  He was treated with antibiotics previously.  Antibiotics included  vancomycin, gentamicin, Rocephin, Levaquin, Zyvox.  A detailed  history  of this previous infection and treatment can be obtained from the  consultation note in the E chart from Dr. Clinton Gallant consultation with  infectious disease last month.  Because of the left renal vein  thrombosis, the patient was started on Coumadin. He is taking Coumadin 5  mg every [name] except 1 [name] in the week where he takes 7.5 mg daily.  The  patient started getting neutropenic about 7-10 days ago and his  neutropenia has been persistent even though his Rocephin and vancomycin  were stopped and changed to Levaquin and Zyvox. The patient has been  taking antibiotic without any side effects.  The patient mentioned that  he had some itching to vancomycin but not severe.  The patient denied  any  red color discoloration with vancomycin dosing or any rash noticed  in the past.  The patient is asymptomatic except some shortness of  breath on exertion and new onset of liquid stool today.  The patient  also having some migraine headache at this  time.   PAST MEDICAL HISTORY:  Complicated left pyelonephritis with a pulmonary  emboli and left renal vein thrombosis,  history of migraine headaches,  history of shoulder repair and  new onset neutropenia recently.   ALLERGIES:  None.   SOCIAL HISTORY:  The patient lives by himself in [place].  No recent  alcohol, drug, or tobacco use.   FAMILY HISTORY:  Nothing remarkable.   REVIEW OF SYSTEMS:  Positive as per HPI, otherwise negative review of  systems done for 14 systems.   HOME MEDICATION LIST:  1. Coumadin dose as in HPI.  2. Vitamin B6 at 50 mg daily.  3. Levaquin 750 mg daily.  4. Zyvox 600 mg b.i.d.  5. Coumadin as above.   PHYSICAL EXAMINATION:  VITAL SIGNS:  Blood pressure 109/ 76, pulse 79,  respirations 16, temperature  98.7, oxygen saturation 100% on room air.  GENERAL:  Alert, oriented x3 laying in bed without any acute distress.  CARDIOVASCULAR:  S1 and S2 regular.  No murmur, rub or  gallop.  LUNGS:  Clear to auscultation bilaterally.  No wheezing, rhonchi,  crackles.  ABDOMEN:  Nontender, nondistended.  Bowel sounds present.  EXTREMITIES:  No clubbing, cyanosis or edema.  Was moving all four  extremities.  No Osler's node or Janeway's lesions in the nails.  HEAD:  Normocephalic nontraumatic.  EYES:  Pupils equal, round, reactive to light and accommodation.  Extraocular muscles intact.  No subconjunctival hemorrhage.  ORAL CAVITY:  - Mucous membranes are moist.  No thrush noted.  NECK:  No thyromegaly or JVD.  No lymphadenopathy in neck or axilla.  NEUROLOGICAL:  Exam shows intact cranial nerves, muscle strength,  sensation, reflexes.  SKIN:  No rash or bruits.    Lab work will be obtained in his admission and it is pending.   ASSESSMENT AND PLAN:  1. New-onset neutropenia with blast cells suspect malignancy versus      adverse reaction to antibiotics which is less likely versus immune      complex sequelae to infection.  2. Complicated improving   left pyelonephritis with pulmonary emboli      and left renal vein thrombosis on Coumadin.  3. History of  migraine headache.  4. New onset diarrhea, rule out Clostridium difficile.   PLAN:  Will admit the patient on telemetry bed on [facility].  The  patient will be full code. Will get vital signs and input and output  every 8 hours.  Will check CBC with differential and peripheral smear  stat.  The patient will be seen by Hematology/Oncology Team today and  they will speak with the patient. Will get infectious disease consult in  the morning.  We will continue antibiotics, Levaquin and Zyvox at the  current dose.  The patient will get stool for C diff to rule out  C.  difficile infection.  Will get blood cultures also x2.  Will get  comprehensive metabolic panel, PT/INR also for follow-up of Coumadin.  Will continue Coumadin along with vitamin B6.  For headache and pain, we  will give the patient morphine and Percocet as needed.  We will provide  Zofran for any nausea, vomiting p.r.n.  The patient will also receive  Ambien for sleep at night.  Further plan according to  Hematology/Oncology input pending.      Martin Furlong, MD  Electronically Signed     TVP/MEDQ  D:  01/15/2009  T:  01/15/2009  Job:  [id number]   cc:   Martin [name], M.D.  Fax: [phone]

## 2011-01-24 NOTE — Consult Note (Signed)
NAME:  LITTLE MD, AUL MANGIERI NO.:  [id number]   MEDICAL RECORD NO.:  [id number]          PATIENT TYPE:  AMB   LOCATION:  ENDO                         FACILITY:  [facility]   PHYSICIAN:  Acey Lav, MD  DATE OF BIRTH:  11-07-1955   DATE OF CONSULTATION:  12/23/2008  DATE OF DISCHARGE:                                 CONSULTATION   REQUESTING PHYSICIAN:  Theressa Millard, M.D.   REASON FOR INFECTIOUS DISEASE CONSULTATION:  This is a patient with  bilateral multifocal cavitary and noncavitary pulmonary nodules and a  renal vein thrombus after having been treated for pyelonephritis.   HISTORY OF PRESENT ILLNESS:  Martin [name] is a 55 year old Caucasian  gentleman with no significant past medical history other than a few  orthopedic procedures who had undergone screening colonoscopy  approximately a week prior to Easter Sunday.  On Easter Sunday he  developed fevers and left lower quadrant abdominal pain.  He was  concerned that he might have had a complication due to his colonoscopy  or that he might have some type of infection.  He went to his office and  did a urine dipstick which was positive for white cells and red cells.  He started himself on high dose levofloxacin at 750 mg daily.  He called  Dr. Caryl Pina and obtained a visit with Dr. Caryl Pina.  Dr. Caryl Pina  obtained a culture from Martin [name].  He also did a cystogram which  apparently showed evidence of pyelonephritis.  Dr. Caryl Pina then gave  the patient an intramuscular dose of ceftriaxone as well as a dose of  gentamicin.  He continued an additional 2 days of intramuscular  ceftriaxone and then placed the patient on levofloxacin.  Note, he  called me over the telephone during the course of intramuscular  ceftriaxone and told me about the negative urine cultures and inquired  as to whether high-dose levofloxacin would be appropriate therapy.  I  felt this was quite reasonable.  One thing I did not understand  at that  point in time was that the initial urine cultures obtained by Dr.  Caryl Pina were obtained after Martin [name] had started himself on  levofloxacin.  In the interim the patient continued on high-dose  levofloxacin.  However, he continued to have fevers, chills, drenching  night sweats where he had to change clothes, rigors.  The rigors became  so bad that he felt that he strained a couple of muscles in his chest  wall and was having some musculoskeletal chest wall pain.  Over the  weekend he noticed himself having dyspnea, which worsened until Monday  and Tuesday.  He was seen by one of  his partners, Dr. Benjaman Kindler, who  checked a chest x-ray which showed multiple nodules and some cavitary  lesions.  He had a CT scan of the chest, abdomen, pelvis done.  This  showed multiple bilateral cavitary and noncavitary pulmonary nodules  throughout the lungs.  Most of these were in the lung periphery.  Additionally imaging of the abdomen and pelvis disclosed a perfusion  abnormality in the inferior pole of  the left kidney consistent with  possible pyelonephritis (but was also thought potentially consistent  with venous thrombosis or arterial embolism), along with thrombosis of  the left renal vein.  The patient was admitted to [facility].  I was consulted in Infectious Disease to assist with management of this  patient.  After blood cultures were drawn I recommended the patient be  placed on high-dose ceftriaxone, vancomycin and gentamicin for synergy  for presumptive treatment of endocarditis.  In the interim the patient  has had a transesophageal echocardiogram which apparently has shown no  vegetations on any of his heart valves.  While in the hospital the  patient has been afebrile.  Cultures are still cooking and are not back  at this point in time.  His urinalysis in the hospital does not show him  to have proteinuria.  His labs will be reviewed below.  Currently the   patient no longer has any abdominal pain.  He also has no flank pain.  He still has some slight dyspnea but otherwise has no other complaints  at this point in time.   PAST MEDICAL HISTORY:  He has had a history of shoulder repair.  He has  had an Biochemist, clinical.  He has a history of migraine headaches the past.   CURRENT MEDICATIONS:  At home he was on Levofloxacin 750 mg but no  longer.   IN HOUSE MEDICATIONS:  1. Rocephin 2 grams twice daily started yesterday on the 13th.  2. Gentamicin 120 mg was given on the 13th and now he is on gentamicin      80 mg q. 8.  3. Vancomycin.  He was given 1500 mg initially on the 13th and now is      on a grams q. 8.  4. He is on D5 and half NS.  5. Guaifenesin.  6. Dextromethorphan.  7. Eletriptan.  8. Ativan.  9. Oxycodone.  10.Roxicodone.  11.Ambien.   ALLERGIES:  No known drug allergies.   FAMILY HISTORY:  No history of nephrotic syndrome or hypercoagulable  disorder.   SOCIAL HISTORY:  The patient is single, lives alone.  He has several  dogs.  None of them have licked any wounds or anything on his body  recently.  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  He has traveled along  the [address] and has once been to [facility] park but  otherwise has not been to the [facility].  He has not been outside  of the country.  His annual PPD test in March was nonreactive.  He  denies recreational drug use including intravenous drug use.  He denies  any risk factors for HIV, although he is willing to be tested for HIV.   REVIEW OF SYSTEMS:  As described in history of present illness,  otherwise 10-point review of systems is negative.   PHYSICAL EXAMINATION:  VITAL SIGNS:  Temperature maximum since admission  98.7, which is current temperature, blood pressure 118/73, pulse 80,  respirations 18, pulse oximetry 95% on room air.  Weight 87.1 kg.  GENERAL:  Quite  pleasant gentleman no acute distress.  HEENT:  Normocephalic, atraumatic.  Pupils are equal, round and reactive  to light.  Sclerae anicteric.  Oropharynx clear.  CARDIOVASCULAR EXAM:  Regular rate and rhythm.  No murmurs, gallops,  rubs heard.  LUNGS:  Clear auscultation bilaterally without wheezes, rhonchi or  rales.  ABDOMEN:  Soft, nondistended, nontender.  No evidence of organomegaly.  EXTREMITIES:  Without edema.  SKIN:  Few scratches on his lower extremities but no rashes.  His  fingernails were without any evidence of splinters and he had no  evidence of Osler nodes.   CT scan as described above of the chest, abdomen, pelvis.   LABORATORY DATA:  Urinalysis:  0-2 white cells, 11-20 red blood cells,  rare bacteria, specific gravity 1.009, pH 7.5.  Negative for glucose,  bilirubin, ketones, large amount of blood, negative for protein, 0.2  urobilinogen.  Negative for nitrites and leukocyte esterase.  CBC  differential:  White count 9.8, hemoglobin of 11.5, platelets 400.  Metabolic panel yesterday:  Sodium 134, potassium 4, chloride 97, bicarb  29, BUN and creatinine 12.8 and 0.9, glucose 134, calcium 8.6.  PTT  initially 35.  PT 1.3.  Blood cultures x2 are pending.  HIV antibody  test pending from this morning.   IMPRESSION/RECOMMENDATIONS:  This is a 55 year old previously healthy  Caucasian gentleman who presented with acute onset of left lower  quadrant pain who then checked urine dipstick, found himself to have  pyuria, treated himself with levofloxacin.  He saw Dr. Caryl Pina who  performed imaging which showed evidence of pyelonephritis and treated  the patient with gentamicin and ceftriaxone.  The ceftriaxone had been  given for 3 days followed by a prolonged course of levofloxacin.  The  patient unfortunately did not improve symptomatically and then developed  pulmonary symptoms, now found to have a renal vein thrombosis as well as  evidence of multiple cavitary nodules  on a CT scan of the chest.  1. Multiple cavitary nodules in the lungs.  These seem most consistent      with emboli.  This may be due to septic emboli from the heart      although his transesophageal echocardiogram shows no evidence of      endocarditis with no vegetations.  MOST LIKELY  it is due to      embolization from his newly discovered left renal vein thrombus.      These could be septic emboli or (aseptic) emboli.  Less likely      causes would include infection with tuberculosis.  This seems      unlikely given the distribution of the lesions.  Additionally other      potential causes would be dimorphic fungi including Cryptococcus      and histoplasmosis although these are rare to find in disseminated      infections in patients without immunocompromise.  The patient does      not have specific risk factors for histoplasmosis such as      spelunking.  Blastomycosis potentially can cause pulmonary lesions      as well though I have not seen itcause urinary tract infection.      For thoroughness' sake, I am going to place him in respiratory      isolation and airborne isolation and induce sputum for AFB now and      every 8 hours for three sputum samples. We will place a PPD again.      I am going to check a serum cryptococcal antigen, a urine      histoplasmic antigen, as well as urine blastomyces antigen.  Note,      most of these laboratories should be sent to [facility].  We will follow up on his blood  cultures, HIV test.      We will make sure that Dr. Caryl Pina sends [place] the finalized      cultures from his laboratory, which reportedly were negative.  For      now would continue him on vancomycin, ceftriaxone and gentamicin.      I feel that this patient should be treated as if he has an an      endovascular infection even though we cannot prove or disprove this      (ie it could be an aseptic process triggered by the renal vein      thrombus).  Therefore  at this point in time again we will treat him      with ceftriaxone, vancomycin and gentamicin for the time being.   1. Renal vein thrombosis:  This has been highly associated with      nephrotic syndrome which the patient has absolutely no evidence of      having.  It is also associated with renal cell carcinoma which      again he does not have evidence of on CT scan.  It has also been      associated with infections such as pyelonephritis. In one reference      infection was asociated with increased morbidity. A few references      include:  case in the Journal of Radiology of pyelonephritis      complicated by suppurative thrombosis of the renal vein with septic      pulmonary embolism from Journal of Radiology 2007, July-August,      line 88, 7-8, Part I in [place].  I am not able to get to it due to      [facility] blocking this reference.  There is also another      reference renal vein thrombosis, a forgotten complication of acute      pyelonephritis in Press Med 1997, again another [place] journal,      which I cannot access through the computer system.  It might be      prudent to engage a hematologist to make sure that we have done      everything we can to exclude other causes of hypercoagulability or      causes of renal vein thrombosis.  Obviously the patient does not      have nephrotic syndrome or renal cell carcinoma and the most likely      culprit seems to be infection and pyelonephritis, but if there are      any other potential causes we would like to make sure these have      been worked up. For now I have ordered PT gene mutation, and Factor      V Leiden tests My understanding of renal vein thrombosis at least      in the scenario of nephrotic syndrome is one should be      anticoagulated for 6-12 months. I would recommend treating him for      similar duration but again think it would be prudent ingage a      hematologist might be of help in terms of guiding [place] with  duration      of therapy for      anticoagulation.  Thank you for this  most interesting and challenging Infectious Disease  consultation.  I will follow along Martin [name] closely.      Acey Lav, MD  Electronically Signed     CV/MEDQ  D:  12/23/2008  T:  12/23/2008  Job:  [id number]   cc:   Charlaine Dalton. Sherene Sires, MD, FCCP  [address]  [place] [address]   Martina Sinner, MD  Fax: [phone]   Genene Churn. Cyndie Chime, M.D.  Fax: [phone]   Theressa Millard, M.D.  Fax: [phone]

## 2011-01-27 NOTE — Procedures (Signed)
[facility]  Patient:    Martin [name] MDShahzaib, Azevedo Visit Number: [id number] MRN: [id number]          Service Type: END Location: ENDO Attending Physician:  Louie Bun Dictated by:   Everardo All Madilyn Fireman, M.D. Proc. Date: 08/30/01 Admit Date:  08/30/2001   CC:         Caryn Bee L. Little, M.D.   Procedure Report  PROCEDURE:  Esophagogastroduodenoscopy.  INDICATIONS FOR PROCEDURE:  A patient with chronic gastroesophageal reflux who on EGD two years ago was found to have two small islands of apparent columnar epithelium in the proximal esophagus of unclear significance. We elected to repeat EGD in two years to assess for persistence or enlargement of these areas as well as to obtain biopsies to rule out intestinal metaplasia or dysplasia possibly representing a variant of Barretts esophagus.  DESCRIPTION OF PROCEDURE:  The patient was placed in the left lateral decubitus position then placed on the pulse monitored with continuous low flow oxygen delivered by nasal cannula. He was sedated with 60 mg IV Demerol and 8 mg IV Versed. The Olympus video endoscope was advanced under direct vision into the oropharynx and esophagus. The esophagus was straight and of normal caliber with the squamocolumnar line sharply outlined at 38 cm with no erosions, esophagitis or significant irregularity of the Z line. I could not appreciate a ring, stricture or hiatal hernia. In addition, the proximal and middle esophagus were carefully inspected and I could see no interruptions of the squamous mucosa corresponding with the appearance on previous EGD two years ago. A considerable amount of time was spent searching the proximal esophagus but no abnormality was found. The stomach was entered and a small amount of liquid secretions were suctioned from the fundus. Retroflexed view of the cardia was unremarkable. The fundus, body, antrum, and pylorus all appeared normal. The duodenum  was entered and both the bulb and second portion were well inspected and appeared to be within normal limits. The scope was then withdrawn and the patient returned to the recovery room in stable condition. The patient tolerated the procedure well and there were no immediate complications.  IMPRESSION:  Apparent regression of small islands of presumed columnar epithelium in the proximal esophagus. Unclear whether this represented some self limited injury such as pill induced esophagitis or other self limiting injury or pathology.  PLAN:  In as much as there does not appear to be any evidence of Barretts esophagus will manage expectantly and treat gastroesophageal reflux and continue medical management, dietary and lifestyle modifications for gastroesophageal reflux. Dictated by:   Everardo All Madilyn Fireman, M.D. Attending Physician:  Louie Bun DD:  08/30/01 TD:  08/31/01 Job: [id number]

## 2011-01-27 NOTE — Op Note (Signed)
. [facility]  Patient:    Martin [name], Martin [name]                    MRN: [id number] Proc. Date: 01/17/00 Adm. Date:  [id number] Attending:  Sypher, Douglass Rivers CC:         Martin [name], Martin [name]., M.D. 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  POSTOPERATIVE DIAGNOSIS:  Necrotic common extensor origin, right elbow.  OPERATION PERFORMED:  Debridement of necrotic common extensor origin followed by decortication of right lateral epicondyle and reconstruction of common extensor origin with suture to bone.  SURGEON:  Martin [name], Martin [name]., M.D.  ASSISTANT:  None.  ANESTHESIA:  General by LMA after failed axillary block.  ANESTHESIOLOGIST:  Dr. Ivin [name].  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  He has had chronic right lateral epicondylitis for years with a severe exacerbation during the past six months.  After failure to respond to an ultrasound program with stretching and activity modification, he has tried a series of steroid injections. He had transient relief after the first two injections and no relief after the third.  He requested surgical reconstruction of his common extensor origin at this time. He understands that he will need to have marked limitation of his activities for the first six weeks following surgery and will require a four to six week rehabilitation problem after the tendon has healed to recovery strength and ability to play tennis without pain.  After informed consent he is brought to the operating room at this time for reconstruction of his right common extensor origin.  DESCRIPTION OF PROCEDURE:  Martin [name] was brought to the operating room and placed in supine  position on the operating table.  Dr. Ivin [name] had placed an axillary block in the holding area; however, anesthesia was inadequate to allow surgery.  Therefore, general anesthesia was induced by LMA.  The right arm was prepped with Betadine soap and solution and sterilely draped.  A pneumatic tourniquet was applied to the proximal brachium.  One gram of Ancef was administered as an IV prophylactic antibiotic.  Following exsanguination of the limb with an Esmarch bandage, the arterial tourniquet was inflated to 220 mmHg.  The procedure commenced with a curvilinear incision posterior to the epicondyle and overlying the anconeus muscle.  The subcutaneous tissues were carefully divided taking care to incise the fascia between the triceps fascia and the dorsal forearm fascia.  A rather large anconeus muscle was noted to be distally positioned due to rupture of its origin off of the epicondyle.  The extensor carpi radialis longus was elevated and a very necrotic-appearing extensor carpi radialis brevis was noted with a thumbnail-sized cavity deep to the extensor carpi radialis longus.  The brevis had ruptured off the epicondyle and had retracted down near the radial capitellar articulation.  The extensor carpi radialis longus was released with an osteotome and a portion of the common finger extensors released with an osteotome allowing full visualization of the lateral epicondyle.  The epicondyle was decorticated and all necrotic tendon tissues were removed. A 0.035 inch Kirschner wire was used to drill approximately 30 holes through the  lateral epicondyle to revascularize the tendon.  The elbow was placed in full extension and the wrist fully extended.  The common extensor origin was repaired with three mattress sutures of #2 Ethibond to bone.  The common extensor origin was quite tight and was distally translated at approximately 5 mm at the time of repair.  The anconeus muscle was pulled  proximally and oversewn along the repair to pad the epicondyle and to increase the vascularity of the necrotic tendon.  The fascia was then repaired with a running suture of 2-0 Vicryl.  The wound was infiltrated with 0.25% Marcaine without epinephrine followed by repair of the skin with intradermal 3-0 Prolene suture.  The tourniquet was released and immediate capillary refill was noted to all digits.  The wounds were dressed with Martin [name], sterile gauze, Kerlix, sterile Webril and a volar plaster splint maintaining the wrist in 40 degrees dorsiflexion. There were no apparent complications.  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  He was transferred to the recovery room with stable vital signs.  He will be discharged to the care of friends for observation overnight. DD:  01/17/00 TD:  01/18/00 Job: [id number]

## 2011-01-27 NOTE — Procedures (Signed)
[facility]  Patient:    Martin Duke MD, DORSEY AUTHEMENT                    MRN: [id number] Proc. Date: 06/22/00 Adm. Date:  [id number] Disc. Date: [id number] Attending:  Louie Bun CC:         Anna Genre. Little, M.D.   Procedure Report  PROCEDURE:  Esophagogastroduodenoscopy.  INDICATION FOR PROCEDURE:  Longstanding gastroesophageal reflux symptoms which have become more refractory to medical management recently.  Procedure is to assess for presence and size of hiatal hernia, persistent esophagitis, possible Barretts esophagus.  DESCRIPTION OF PROCEDURE:  The patient was placed in the left lateral decubitus position and placed on the pulse monitor with continuous low-flow oxygen delivered by nasal cannula.  He was sedated with 80 mg IV Demerol and 9 mg IV Versed.  The Olympus video endoscope was advanced under direct visualization into the oropharynx and esophagus.  The esophagus was straight and of normal caliber with the squamocolumnar line at 38 cm.  There was no visible hiatal hernia, ring, stricture, or any evidence of Barretts esophagus.  However, there were two islands of columnar appearing epithelium in the extreme proximal esophagus at approximately 18 cm.  These were photographed but not biopsied.  The mucosa there was flat, and there was no visible suspicion of neoplasm.  The stomach was entered, and a small amount of liquid secretions were suctioned from the fundus.  Retroflex view of the cardia was unremarkable.  The fundus, body, antrum, and pylorus all appeared normal.  The duodenum was entered, and both the bulb and the second portion were well inspected and appeared to be within normal limits.  The endoscope was then withdrawn back into the stomach and CLOtest obtained.  The scope was then withdrawn, and the patient returned to the recovery room in stable condition.  He tolerated the procedure well, and there were no  immediate complications.  IMPRESSION: 1. Two small islands of apparent columnar epithelium high in the esophagus    with no true Barretts esophagus and the squamocolumnar line sharply    outlined at 38 cm. 2. Otherwise basically normal endoscopy.  PLAN:  Await CLOtest and will continue medical management of gastroesophageal reflux.  Significance of the islands of columnar mucosa unclear, and will consider repeat EGD with biopsy in approximately two years. DD:  06/22/00 TD:  06/24/00 Job: [id number]

## 2013-09-11 DIAGNOSIS — I7779 Dissection of other artery: Secondary | ICD-10-CM

## 2013-09-11 HISTORY — DX: Dissection of other specified artery: I77.79

## 2014-08-03 ENCOUNTER — Other Ambulatory Visit: Payer: Self-pay | Admitting: Gastroenterology

## 2014-08-03 DIAGNOSIS — R1030 Lower abdominal pain, unspecified: Secondary | ICD-10-CM

## 2014-08-04 ENCOUNTER — Ambulatory Visit
Admission: RE | Admit: 2014-08-04 | Discharge: 2014-08-04 | Disposition: A | Discharge status: Home / Self Care | Payer: BC Managed Care – PPO | Source: Ambulatory Visit | Attending: Gastroenterology | Admitting: Gastroenterology

## 2014-08-04 DIAGNOSIS — R1030 Lower abdominal pain, unspecified: Secondary | ICD-10-CM

## 2014-08-04 MED ORDER — IOHEXOL 300 MG/ML  SOLN
125.0000 mL | Freq: Once | INTRAMUSCULAR | Status: AC | PRN
  Administered 2014-08-04: 125 mL via INTRAVENOUS

## 2014-08-05 ENCOUNTER — Encounter: Payer: Self-pay | Admitting: Vascular Surgery

## 2014-08-05 ENCOUNTER — Ambulatory Visit (INDEPENDENT_AMBULATORY_CARE_PROVIDER_SITE_OTHER): Payer: BC Managed Care – PPO | Admitting: Vascular Surgery

## 2014-08-05 VITALS — BP 132/93 | HR 50 | Ht 74.0 in | Wt 210.6 lb

## 2014-08-05 DIAGNOSIS — I7772 Dissection of iliac artery: Secondary | ICD-10-CM

## 2014-08-05 NOTE — Progress Notes (Signed)
Patient name: Martin KAMAU Md MRN: [id number] DOB: [birth year] Sex: male   Referred by: Teena Irani  Reason for referral:  Chief Complaint  Patient presents with  . 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 He does not feel that this is specifically postprandial but decided to back off on food intake for several days and had marked improvement in his symptoms when this occurred. He denies any tenderness over his abdomen. He has had no change in his bowel habits. Simply feels some bloating across his epigastrium and some nausea associated with this. He does not have any weight loss. No back pain. I do not have any other of his records regarding workup this was the reason for recent CT scan yesterday for further evaluation. This was normal except he does have an irregularity of the celiac artery and is seeing [place] today for further discussion of this. Reports that he has had a 2-D echocardiogram in the past showing no evidence of valvular irregularity  History reviewed. No pertinent past medical history.  Past Surgical History  Procedure Laterality Date  . Anterior cruciate ligament repair Right   . Elbow surgery Right     History   Social History  . Marital Status: Married    Spouse Name: N/A    Number of Children: N/A  . Years of Education: N/A   Occupational History  . Not on file.   Social History Main Topics  . Smoking status: Never Smoker   . Smokeless tobacco: Never Used  . Alcohol Use: No  . Drug Use: No  . Sexual Activity: Not on file   Other Topics Concern  . Not on file   Social History Narrative  . No narrative on file    Family History  Problem Relation Age of Onset  . Hypertension Mother   . Heart attack Father     Allergies as of 08/05/2014 - Review Complete 08/05/2014  Allergen Reaction Noted  .  Vancomycin      No current outpatient prescriptions on file prior to visit.   No current facility-administered medications on file prior to visit.     REVIEW OF SYSTEMS:  Negative except for past history   PHYSICAL EXAMINATION:  General: The patient is a well-nourished male, in no acute distress. Vital signs are BP 132/93 mmHg  Pulse 50  Ht 6\' 2"  (1.88 m)  Wt 210 lb 9.6 oz (95.528 kg)  BMI 27.03 kg/m2  SpO2 100% Pulmonary: There is a good air exchange Abdomen: Soft and non-tender with normal pitch bowel sounds. No bruit present and no masses noted Musculoskeletal: There are no major deformities.  There is no significant extremity pain. Neurologic: No focal weakness or paresthesias are detected, Skin: There are no ulcer or rashes noted. Psychiatric: The patient has normal affect. Cardiovascular: There is a regular rate and rhythm without significant murmur appreciated. Carotid arteries without bruits bilaterally Pulse status: 2+ radial 2+ femoral and 2+ dorsalis pedis pulses bilaterally  I did review his CT scan and reviewed the actual films with Dr. Rex Kras. He does have irregularity of the proximal celiac artery. On review there is no evidence of stenosis. He does have either a dissection with some mural thrombus present or some arteritis and thickening of the artery itself. The artery distal this is completely normal  as is his sperm mesenteric artery and celiac artery.  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 Had very long discussion with Dr. Rex Kras. I explained that there is no way of knowing whether this is an incidental finding or has some association with his recent symptoms. He did have CT scan 2010. This was a noncontrast but did not show any evidence of dissection or enlargement at this area 5 years ago. I do not see any evidence of other dissection or other irregularity. I do not feel there is any indication for anticoagulation. There  would be no role currently for arteriography or further evaluation. He has not had any fevers or pain that would support the diagnosis for infectious etiology of this. I have recommended that we do a CT angiogram of this region in 3 months for follow-up to determine if this is chronic or if there is a change in this. There've been no role for stenting. Explain only option would be surgical and certainly do not see any indication for this currently. He will notify should he develop any new difficulties otherwise will see me again in 3 months for continued follow-up    EARLY, TODD [facility] [place]: [phone]

## 2014-11-09 ENCOUNTER — Encounter: Payer: Self-pay | Admitting: Vascular Surgery

## 2014-11-10 ENCOUNTER — Other Ambulatory Visit: Payer: BC Managed Care – PPO

## 2014-11-10 ENCOUNTER — Ambulatory Visit (INDEPENDENT_AMBULATORY_CARE_PROVIDER_SITE_OTHER): Payer: BLUE CROSS/BLUE SHIELD | Admitting: Vascular Surgery

## 2014-11-10 ENCOUNTER — Ambulatory Visit
Admission: RE | Admit: 2014-11-10 | Discharge: 2014-11-10 | Disposition: A | Discharge status: Home / Self Care | Payer: BLUE CROSS/BLUE SHIELD | Source: Ambulatory Visit | Attending: Vascular Surgery | Admitting: Vascular Surgery

## 2014-11-10 ENCOUNTER — Encounter: Payer: Self-pay | Admitting: Vascular Surgery

## 2014-11-10 VITALS — BP 139/88 | HR 53 | Resp 16 | Ht 74.0 in | Wt 213.0 lb

## 2014-11-10 DIAGNOSIS — I7779 Dissection of other artery: Secondary | ICD-10-CM | POA: Diagnosis not present

## 2014-11-10 DIAGNOSIS — I7772 Dissection of iliac artery: Secondary | ICD-10-CM

## 2014-11-10 MED ORDER — IOPAMIDOL (ISOVUE-370) INJECTION 76%
75.0000 mL | Freq: Once | INTRAVENOUS | Status: AC | PRN
  Administered 2014-11-10: 75 mL via INTRAVENOUS

## 2014-11-10 NOTE — Progress Notes (Signed)
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 He reports o abdominal problems specifically no pain. He has no postprandial symptoms. No weight loss.  History reviewed. No pertinent past medical history.  History  Substance Use Topics  . Smoking status: Never Smoker   . Smokeless tobacco: Never Used  . Alcohol Use: No    Family History  Problem Relation Age of Onset  . Hypertension Mother   . Heart attack Father     Allergies  Allergen Reactions  . Vancomycin     REACTION: neutropenia     Current outpatient prescriptions:  .  cetirizine (ZYRTEC) 10 MG tablet, Take 10 mg by mouth daily., Disp: , Rfl:  .  esomeprazole (NEXIUM) 20 MG capsule, Take 20 mg by mouth daily at 12 noon., Disp: , Rfl:  .  finasteride (PROSCAR) 5 MG tablet, Take 5 mg by mouth daily., Disp: , Rfl:  .  fluticasone (FLONASE) 50 MCG/ACT nasal spray, Place 2 sprays into both nostrils daily., Disp: , Rfl:   BP 139/88 mmHg  Pulse 53  Resp 16  Ht 6\' 2"  (1.88 m)  Wt 213 lb (96.616 kg)  BMI 27.34 kg/m2  Body mass index is 27.34 kg/(m^2).        On physical exam of no acute distress hemodynamically stable. No weight loss.    I reviewed his CT scan and his specific images with the patient. We looked at the current films and also films from 3 months ago and also films from 2010. 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 Certainly is a suggestion of abnormality at the same area and his films from 2010 although this was not well visualized.   Impression and plan stable chronic celiac artery dissection. Recommended repeat CT scan in one year to rule out any change. He will notify should he develop any new abdominal pain or postprandial symptoms. I explained that this would be highly unlikely. I feel that most likely this is chronic dissection is been there for 5-6 years at least. Doubt that it'll ever calls difficulty lifelong.  Will see him  again in one year

## 2014-11-11 NOTE — Addendum Note (Signed)
Addended by: Dorthula Rue L on: 11/11/2014 11:50 AM   Modules accepted: Orders

## 2014-12-11 HISTORY — PX: SHOULDER ACROMIOPLASTY: SHX6093

## 2015-03-17 ENCOUNTER — Other Ambulatory Visit: Payer: Self-pay | Admitting: Family Medicine

## 2015-03-17 ENCOUNTER — Ambulatory Visit
Admission: RE | Admit: 2015-03-17 | Discharge: 2015-03-17 | Disposition: A | Discharge status: Home / Self Care | Payer: BLUE CROSS/BLUE SHIELD | Source: Ambulatory Visit | Attending: Family Medicine | Admitting: Family Medicine

## 2015-03-17 DIAGNOSIS — R059 Cough, unspecified: Secondary | ICD-10-CM

## 2015-03-17 DIAGNOSIS — R05 Cough: Secondary | ICD-10-CM

## 2015-03-17 DIAGNOSIS — R509 Fever, unspecified: Secondary | ICD-10-CM

## 2015-10-19 ENCOUNTER — Other Ambulatory Visit: Payer: Self-pay | Admitting: Neurosurgery

## 2015-10-19 DIAGNOSIS — M5412 Radiculopathy, cervical region: Secondary | ICD-10-CM

## 2015-10-29 ENCOUNTER — Encounter: Payer: Self-pay | Admitting: Vascular Surgery

## 2015-11-02 ENCOUNTER — Ambulatory Visit
Admission: RE | Admit: 2015-11-02 | Discharge: 2015-11-02 | Disposition: A | Discharge status: Home / Self Care | Payer: BLUE CROSS/BLUE SHIELD | Source: Ambulatory Visit | Attending: Neurosurgery | Admitting: Neurosurgery

## 2015-11-02 DIAGNOSIS — M5412 Radiculopathy, cervical region: Secondary | ICD-10-CM

## 2015-11-09 ENCOUNTER — Encounter: Payer: Self-pay | Admitting: Vascular Surgery

## 2015-11-09 ENCOUNTER — Ambulatory Visit
Admission: RE | Admit: 2015-11-09 | Discharge: 2015-11-09 | Disposition: A | Discharge status: Home / Self Care | Payer: BLUE CROSS/BLUE SHIELD | Source: Ambulatory Visit | Attending: Vascular Surgery | Admitting: Vascular Surgery

## 2015-11-09 ENCOUNTER — Ambulatory Visit (INDEPENDENT_AMBULATORY_CARE_PROVIDER_SITE_OTHER): Payer: BLUE CROSS/BLUE SHIELD | Admitting: Vascular Surgery

## 2015-11-09 VITALS — BP 130/84 | HR 54 | Temp 97.2°F | Resp 16 | Ht 74.0 in | Wt 216.0 lb

## 2015-11-09 DIAGNOSIS — I7779 Dissection of other artery: Secondary | ICD-10-CM

## 2015-11-09 MED ORDER — IOPAMIDOL (ISOVUE-300) INJECTION 61%
80.0000 mL | Freq: Once | INTRAVENOUS | Status: AC | PRN
  Administered 2015-11-09: 80 mL via INTRAVENOUS

## 2015-11-09 NOTE — Progress Notes (Signed)
   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 male  In today for CT follow-up of incidental finding of celiac artery dissection. Had seen in one year ago with CT scan. This showed dissection of his proximal celiac artery prior to splenic artery takeoff. Maximal dilatation of the celiac artery was 1.2 cm at this level. He reports no new major medical problems. No symptoms of mesenteric ischemia.  Current Outpatient Prescriptions  Medication Sig Dispense Refill  . cetirizine (ZYRTEC) 10 MG tablet Take 10 mg by mouth daily.    Marland Kitchen esomeprazole (NEXIUM) 20 MG capsule Take 20 mg by mouth daily at 12 noon.    . finasteride (PROSCAR) 5 MG tablet Take 5 mg by mouth daily.    . fluticasone (FLONASE) 50 MCG/ACT nasal spray Place 2 sprays into both nostrils daily.     No current facility-administered medications for this visit.    REVIEW OF SYSTEMS:  [X]  denotes positive finding, [ ]  denotes negative finding Cardiac  Comments:  Chest pain or chest pressure:    Shortness of breath upon exertion:    Short of breath when lying flat:    Irregular heart rhythm:    Constitutional    Fever or chills:      PHYSICAL EXAM: Filed Vitals:   11/09/15 1345  BP: 130/84  Pulse: 54  Temp: 97.2 F (36.2 C)  Resp: 16  Height: 6\' 2"  (1.88 m)  Weight: 216 lb (97.977 kg)  SpO2: 96%    GENERAL: The patient is a well-nourished male, in no acute distress. The vital signs are documented above. CARDIOVASCULAR:  Palpable radial pulses.  abdomen soft nontender no bruits noted   CT scan ROM today was reviewed. I reviewed the actual images with Dr. Rex Kras I discussed this. This shows no change from his study one year ago. He does have an old CT from 2010 which was not dedicated CT angiogram. I have a strong suspicion that the dissection was present even as far back as  2010.  MEDICAL ISSUES:  stable chronic dissection of celiac artery. No progressive dilatation of the artery. No flow-limiting stenosis. I would recommend discontinuation of follow-up. I do not feel that this puts him at any increased risk for rupture or mesenteric ischemia. He is comfortable with this and let us know if he has any new symptoms.  Curt Jews [facility] [place]: [phone]

## 2015-11-16 ENCOUNTER — Ambulatory Visit: Payer: BLUE CROSS/BLUE SHIELD | Admitting: Vascular Surgery

## 2018-12-09 ENCOUNTER — Other Ambulatory Visit: Payer: Self-pay | Admitting: Otolaryngology

## 2018-12-09 DIAGNOSIS — J329 Chronic sinusitis, unspecified: Secondary | ICD-10-CM

## 2019-01-28 ENCOUNTER — Ambulatory Visit
Admission: RE | Admit: 2019-01-28 | Discharge: 2019-01-28 | Disposition: A | Discharge status: Home / Self Care | Payer: No Typology Code available for payment source | Source: Ambulatory Visit | Attending: Otolaryngology | Admitting: Otolaryngology

## 2019-01-28 DIAGNOSIS — J329 Chronic sinusitis, unspecified: Secondary | ICD-10-CM

## 2020-01-12 ENCOUNTER — Other Ambulatory Visit: Payer: Self-pay | Admitting: Internal Medicine

## 2020-01-12 DIAGNOSIS — F09 Unspecified mental disorder due to known physiological condition: Secondary | ICD-10-CM

## 2020-01-13 ENCOUNTER — Ambulatory Visit
Admission: RE | Admit: 2020-01-13 | Discharge: 2020-01-13 | Disposition: A | Discharge status: Home / Self Care | Payer: No Typology Code available for payment source | Source: Ambulatory Visit | Attending: Internal Medicine | Admitting: Internal Medicine

## 2020-01-13 DIAGNOSIS — F09 Unspecified mental disorder due to known physiological condition: Secondary | ICD-10-CM

## 2020-01-13 MED ORDER — GADOBENATE DIMEGLUMINE 529 MG/ML IV SOLN
20.0000 mL | Freq: Once | INTRAVENOUS | Status: AC | PRN
  Administered 2020-01-13: 20 mL via INTRAVENOUS

## 2020-02-02 ENCOUNTER — Other Ambulatory Visit: Payer: Self-pay | Admitting: Gastroenterology

## 2020-02-02 DIAGNOSIS — K591 Functional diarrhea: Secondary | ICD-10-CM

## 2020-02-02 DIAGNOSIS — R748 Abnormal levels of other serum enzymes: Secondary | ICD-10-CM

## 2020-02-04 ENCOUNTER — Other Ambulatory Visit (HOSPITAL_BASED_OUTPATIENT_CLINIC_OR_DEPARTMENT_OTHER): Payer: Self-pay | Admitting: Gastroenterology

## 2020-02-04 ENCOUNTER — Ambulatory Visit (HOSPITAL_BASED_OUTPATIENT_CLINIC_OR_DEPARTMENT_OTHER)
Admission: RE | Admit: 2020-02-04 | Discharge: 2020-02-04 | Disposition: A | Discharge status: Home / Self Care | Payer: No Typology Code available for payment source | Source: Ambulatory Visit | Attending: Gastroenterology | Admitting: Gastroenterology

## 2020-02-04 ENCOUNTER — Other Ambulatory Visit: Payer: Self-pay

## 2020-02-04 DIAGNOSIS — K591 Functional diarrhea: Secondary | ICD-10-CM | POA: Insufficient documentation

## 2020-02-04 DIAGNOSIS — R748 Abnormal levels of other serum enzymes: Secondary | ICD-10-CM

## 2020-02-04 MED ORDER — IOHEXOL 300 MG/ML  SOLN
100.0000 mL | Freq: Once | INTRAMUSCULAR | Status: AC | PRN
  Administered 2020-02-04: 100 mL via INTRAVENOUS

## 2020-02-13 ENCOUNTER — Ambulatory Visit (HOSPITAL_COMMUNITY): Payer: No Typology Code available for payment source

## 2020-02-16 ENCOUNTER — Ambulatory Visit: Payer: No Typology Code available for payment source | Admitting: Neurology

## 2020-02-17 ENCOUNTER — Other Ambulatory Visit: Payer: No Typology Code available for payment source

## 2020-02-25 ENCOUNTER — Encounter: Payer: Self-pay | Admitting: *Deleted

## 2020-03-01 ENCOUNTER — Encounter: Payer: Self-pay | Admitting: Diagnostic Neuroimaging

## 2020-03-01 ENCOUNTER — Ambulatory Visit (INDEPENDENT_AMBULATORY_CARE_PROVIDER_SITE_OTHER): Payer: No Typology Code available for payment source | Admitting: Diagnostic Neuroimaging

## 2020-03-01 ENCOUNTER — Other Ambulatory Visit: Payer: Self-pay

## 2020-03-01 VITALS — BP 122/66 | HR 76 | Ht 74.0 in | Wt 219.0 lb

## 2020-03-01 DIAGNOSIS — K529 Noninfective gastroenteritis and colitis, unspecified: Secondary | ICD-10-CM

## 2020-03-01 DIAGNOSIS — F488 Other specified nonpsychotic mental disorders: Secondary | ICD-10-CM | POA: Diagnosis not present

## 2020-03-01 DIAGNOSIS — R4189 Other symptoms and signs involving cognitive functions and awareness: Secondary | ICD-10-CM

## 2020-03-01 NOTE — Progress Notes (Addendum)
[facility]  PATIENT: Martin ADKISON Md DOB: 12-14-1955  REFERRING CLINICIAN: [name], MD HISTORY FROM: patient  REASON FOR VISIT: new consult    HISTORICAL  CHIEF COMPLAINT:  Chief Complaint  Patient presents with  . 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  Symptoms wax and wane.  Sometimes he feels brain fog sensation all day.  Other times he feels some difficulty with dictation while performing his work.  For past 5 years patient has had some problems with diarrhea, followed by GI without specific cause found.  He has had extensive testing and possibility of small intestinal bacterial overgrowth syndrome was raised, tried on rifaximin which seem to help her symptoms temporarily.  However follow-up study with another GI specialist could not confirm that patient had this SIBO issue.  He also did blood test indicating possibility of carcinoid syndrome, but so far CT chest abdomen pelvis have been negative.  He has a 24-hour 5-HIAA test scheduled.  Patient also had MRI of the brain with and without contrast which was normal.  I reviewed imaging and agree.  Patient has had some issues with nonallergic rhinitis and sinus issues, with loss of smell and taste for past 5 years.  No problems with arms, legs, balance or walking.  He stays active and walks approximately 30 miles per week.  No balance or coordination issues.  No vision changes.  No swallowing issues.    REVIEW OF SYSTEMS: Full 14 system review of systems performed and negative with exception of: As per HPI.  ALLERGIES: Allergies  Allergen Reactions  . Vancomycin     REACTION: neutropenia    HOME MEDICATIONS: Outpatient Medications Prior to Visit   Medication Sig Dispense Refill  . b complex vitamins tablet Take 1 tablet by mouth daily.    . carisoprodol (SOMA) 350 MG tablet Take 350 mg by mouth 2 (two) times daily as needed.    . Cholecalciferol (VITAMIN D3 PO) Take 800 Units by mouth daily.    Marland Kitchen FA-D3-Mg Cit-Acetylcyst-Ca Cit (FOLITE PO) Take 1 mg by mouth daily.    . famotidine (PEPCID) 20 MG tablet Take 20 mg by mouth 2 (two) times daily.    . finasteride (PROSCAR) 5 MG tablet Take 5 mg by mouth daily.    Marland Kitchen HYDROcodone-acetaminophen (NORCO) 10-325 MG tablet Take by mouth as needed.    . Multiple Vitamin (MULTIVITAMIN) capsule Take 1 capsule by mouth daily.    . multivitamin-lutein (OCUVITE-LUTEIN) CAPS capsule Take 1 capsule by mouth daily.    . rosuvastatin (CRESTOR) 10 MG tablet Take 10 mg by mouth at bedtime.    . vitamin E (VITAMIN E) 180 MG (400 UNITS) capsule Take 800 Units by mouth daily.    . cetirizine (ZYRTEC) 10 MG tablet Take 10 mg by mouth daily.    Marland Kitchen esomeprazole (NEXIUM) 20 MG capsule Take 20 mg by mouth daily at 12 noon.    . fluticasone (FLONASE) 50 MCG/ACT nasal spray Place 2 sprays into both nostrils daily.     No facility-administered medications prior to visit.    PAST MEDICAL HISTORY: Past Medical History:  Diagnosis Date  . C. difficile colitis 2010  . 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 Cognitive dysfunction   .  GERD (gastroesophageal reflux disease)   . Hx of colonic polyp   . Hx of migraine headaches   . Ocular rosacea   . 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 Scoliosis   . Septic pulmonary embolism ([place]) 2010    PAST SURGICAL HISTORY: Past Surgical History:  Procedure Laterality Date  . ANTERIOR CRUCIATE LIGAMENT REPAIR Right 1990  . ELBOW SURGERY Right   . 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 SHOULDER ACROMIOPLASTY Left 12/2014  . shoulder arthroscopy  1995    FAMILY HISTORY: Family History  Problem Relation Age of Onset  . Hypertension Mother   . Heart attack  Father     SOCIAL HISTORY: Social History   Socioeconomic History  . Marital status: Married    Spouse name: Not on file  . Number of children: Not on file  . Years of education: Not on file  . Highest education level: Professional school degree (e.g., MD, DDS, DVM, JD)  Occupational History    Comment: MD  Tobacco Use  . Smoking status: Never Smoker  . Smokeless tobacco: Never Used  Substance and Sexual Activity  . Alcohol use: No    Alcohol/week: 0.0 standard drinks  . Drug use: No  . Sexual activity: Not on file  Other Topics Concern  . Not on file  Social History Narrative  . Not on file   Social Determinants of Health   Financial Resource Strain:   . Difficulty of Paying Living Expenses:   Food Insecurity:   . Worried About Charity fundraiser in the Last Year:   . Arboriculturist in the Last Year:   Transportation Needs:   . Film/video editor (Medical):   Marland Kitchen Lack of Transportation (Non-Medical):   Physical Activity:   . Days of Exercise per Week:   . Minutes of Exercise per Session:   Stress:   . Feeling of Stress :   Social Connections:   . Frequency of Communication with Friends and Family:   . Frequency of Social Gatherings with Friends and Family:   . Attends Religious Services:   . Active Member of Clubs or Organizations:   . Attends Archivist Meetings:   Marland Kitchen Marital Status:   Intimate Partner Violence:   . Fear of Current or Ex-Partner:   . Emotionally Abused:   Marland Kitchen Physically Abused:   . Sexually Abused:      PHYSICAL EXAM  GENERAL EXAM/CONSTITUTIONAL: Vitals:  Vitals:   03/01/20 1241  BP: 122/66  Pulse: 76  Weight: 219 lb (99.3 kg)  Height: _0  (1.88 m)     Body mass index is 28.12 kg/m. Wt Readings from Last 3 Encounters:  03/01/20 219 lb (99.3 kg)  11/09/15 216 lb (98 kg)  11/10/14 213 lb (96.6 kg)     Patient is in no distress; well developed, nourished and groomed; neck is  supple  CARDIOVASCULAR:  Examination of carotid arteries is normal; no carotid bruits  Regular rate and rhythm, no murmurs  Examination of peripheral vascular system by observation and palpation is normal  EYES:  Ophthalmoscopic exam of optic discs and posterior segments is normal; no papilledema or hemorrhages  No exam data present  MUSCULOSKELETAL:  Gait, strength, tone, movements noted in Neurologic exam below  NEUROLOGIC: MENTAL STATUS:  No flowsheet data found.  awake, alert, oriented to person, place and time  recent and remote memory intact  normal attention and concentration  language fluent,  comprehension intact, naming intact  fund of knowledge appropriate  CRANIAL NERVE:   2nd - no papilledema on fundoscopic exam  2nd, 3rd, 4th, 6th - pupils equal and reactive to light, visual fields full to confrontation, extraocular muscles intact, no nystagmus  5th - facial sensation symmetric  7th - facial strength symmetric  8th - hearing intact  9th - palate elevates symmetrically, uvula midline  11th - shoulder shrug symmetric  12th - tongue protrusion midline  MOTOR:   normal bulk and tone, full strength in the BUE, BLE  SENSORY:   normal and symmetric to light touch, temperature, vibration  COORDINATION:   finger-nose-finger, fine finger movements normal  REFLEXES:   deep tendon reflexes present and symmetric  GAIT/STATION:   narrow based gait; able to walk tandem; romberg is negative     DIAGNOSTIC DATA (LABS, IMAGING, TESTING) - I reviewed patient records, labs, notes, testing and imaging myself where available.  Lab Results  Component Value Date   WBC 6.6 01/21/2009   HGB 13.4 01/21/2009   HCT 38.5 01/21/2009   MCV 89.0 01/21/2009   PLT 192 01/21/2009      Component Value Date/Time   NA 138 01/16/2009 0340   K 3.8 01/16/2009 0340   CL 106 01/16/2009 0340   CO2 27 01/16/2009 0340   GLUCOSE 90 01/16/2009 0340   BUN 16  01/16/2009 0340   CREATININE 0.91 01/16/2009 0340   CALCIUM 8.5 01/16/2009 0340   PROT 6.2 01/16/2009 0340   ALBUMIN 3.0 (L) 01/16/2009 0340   AST 29 01/16/2009 0340   ALT 35 01/16/2009 0340   ALKPHOS 109 01/16/2009 0340   BILITOT 0.5 01/16/2009 0340   GFRNONAA >60 01/16/2009 0340   GFRAA  01/16/2009 0340    >60        The eGFR has been calculated using the MDRD equation. This calculation has not been validated in all clinical situations. eGFR's persistently <60 mL/min signify possible Chronic Kidney Disease.   No results found for: CHOL, HDL, LDLCALC, LDLDIRECT, TRIG, CHOLHDL No results found for: HGBA1C No results found for: VITAMINB12 No results found for: TSH   01/13/20 MRI brain [I reviewed images myself and agree with interpretation. -VRP]  - normal    ASSESSMENT AND PLAN  64 y.o. 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  Neurologic examination unremarkable today.  MRI of the brain with and without contrast normal.  Vitamin B1 and B12 levels are normal.  No primary neurologic etiology of symptoms found at this point.  Constellation of symptoms could be related to his GI issues.  Dx:  1. Brain fog   2. Chronic diarrhea     PLAN:  - supportive care; monitor symptoms - follow up GI testing and treatments  Return for pending if symptoms worsen or fail to improve.   Addendum 10/08/2020: FDG Pet scan had some subtle findings but at this time the clinical picture is cloudy. We need beta amyloid for further investigation, patient is on FMLA awaiting to see if he goes back to work or not.   Addendum Sarina Ill MD 09/28/2020: Patient contacted the office, reported he is finding it more difficult to communicate with people, difficulty expressing his thoughts, difficulty  getting words out and aphasia and struggling to name objects.  He also reports brain fog,  cognitive memory decline, at this time we will order formal neurocognitive testing.  I also think we can try an FDG PET scan to differentiate between a frontotemporal dementia such as primary progressive aphasia or possibly the beginning of an Alzheimer's or other neurodegenerative disorder.  At this stage it is often very difficult to diagnose.  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 Will refer to [facility], I'm hopeful we can get all of this completed before he can be seen there.    Evaluate for Alzheimers vs FTD(specifically primary progressive aphasia)  Penni Bombard, MD 9/59/7471, 8:55 PM Certified in Neurology, Neurophysiology and Neuroimaging  [facility] [address] [place], [place] [address] [phone]

## 2020-09-23 ENCOUNTER — Telehealth: Payer: Self-pay | Admitting: Diagnostic Neuroimaging

## 2020-09-23 NOTE — Telephone Encounter (Signed)
Patient is requesting a provider switch. He saw Dr. Leta Baptist for an initial consult in June of 2021 and is being referred back for f/u-experiencing same symptoms that have not resolved and requesting to see Dr. Jaynee Eagles. 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 Please advise if this is acceptable. Thank you

## 2020-09-23 NOTE — Telephone Encounter (Signed)
I read Dr. Gladstone Lighter thorough notes and I don't think I have anymore to add. If patient wants a second opinion I would suggest he go to a specialized memory clinic with doctors who are sub-specialized in memory disorders. He can ask his primary care physician for a referral to [facility] or [place] memory clinics. Sorry, but I think his care would be best at a memory clinic at [facility] or [place]. thanks

## 2020-09-28 ENCOUNTER — Other Ambulatory Visit: Payer: Self-pay | Admitting: Neurology

## 2020-09-28 DIAGNOSIS — R4189 Other symptoms and signs involving cognitive functions and awareness: Secondary | ICD-10-CM

## 2020-09-28 DIAGNOSIS — R4701 Aphasia: Secondary | ICD-10-CM

## 2020-09-28 DIAGNOSIS — R413 Other amnesia: Secondary | ICD-10-CM

## 2020-09-28 DIAGNOSIS — R4789 Other speech disturbances: Secondary | ICD-10-CM

## 2020-09-28 DIAGNOSIS — R471 Dysarthria and anarthria: Secondary | ICD-10-CM

## 2020-09-29 ENCOUNTER — Encounter: Payer: Self-pay | Admitting: Psychology

## 2020-09-29 ENCOUNTER — Telehealth: Payer: Self-pay | Admitting: Diagnostic Neuroimaging

## 2020-09-29 NOTE — Telephone Encounter (Signed)
I have talked to patient relayed . PET scan approved Martin [name] will call him to schedule [phone] Dr. Sima Matas office will call her to schedule  Martin [name] from [facility] is going to call and schedule patient .

## 2020-10-04 ENCOUNTER — Other Ambulatory Visit: Payer: Self-pay | Admitting: Neurology

## 2020-10-04 DIAGNOSIS — R4789 Other speech disturbances: Secondary | ICD-10-CM

## 2020-10-04 DIAGNOSIS — G3109 Other frontotemporal dementia: Secondary | ICD-10-CM

## 2020-10-04 DIAGNOSIS — R4701 Aphasia: Secondary | ICD-10-CM

## 2020-10-04 DIAGNOSIS — F0390 Unspecified dementia without behavioral disturbance: Secondary | ICD-10-CM

## 2020-10-05 ENCOUNTER — Telehealth: Payer: Self-pay | Admitting: Neurology

## 2020-10-05 NOTE — Telephone Encounter (Signed)
Yes when I moved Pet Scan up. I called [place] at Dr Sima Matas 's office and relayed he may be a little and could you please accept she said no problem . He will still be seen.

## 2020-10-05 NOTE — Telephone Encounter (Signed)
Dr Sima Matas is hard ot get an appointment with, can he keep that???

## 2020-10-05 NOTE — Telephone Encounter (Signed)
I talked to him this morning he was happy .

## 2020-10-05 NOTE — Telephone Encounter (Signed)
PET scan has been Move up he is going tomorrow at 10/06/2020 arrive at [facility] at 8:30 for 9:00 apt only water 6 hours hours before . I have talked to patient he is aware of detail's   Apt with Dr. Sima Matas  At 11:00  10/06/2020  . Im going to get that moved . Thanks Hinton Dyer

## 2020-10-05 NOTE — Telephone Encounter (Signed)
THANK YOU !!! 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

## 2020-10-05 NOTE — Telephone Encounter (Signed)
spoke to the pt he states he would like to have the PET scan first  medcost auth: [id number] (exp. 10/05/20 to 01/03/21)

## 2020-10-06 ENCOUNTER — Ambulatory Visit (HOSPITAL_COMMUNITY)
Admission: RE | Admit: 2020-10-06 | Discharge: 2020-10-06 | Disposition: A | Discharge status: Home / Self Care | Payer: No Typology Code available for payment source | Source: Ambulatory Visit | Attending: Neurology | Admitting: Neurology

## 2020-10-06 ENCOUNTER — Other Ambulatory Visit: Payer: Self-pay

## 2020-10-06 ENCOUNTER — Encounter: Payer: PRIVATE HEALTH INSURANCE | Attending: Psychology | Admitting: Psychology

## 2020-10-06 DIAGNOSIS — R4789 Other speech disturbances: Secondary | ICD-10-CM | POA: Insufficient documentation

## 2020-10-06 DIAGNOSIS — R413 Other amnesia: Secondary | ICD-10-CM

## 2020-10-06 DIAGNOSIS — R471 Dysarthria and anarthria: Secondary | ICD-10-CM | POA: Insufficient documentation

## 2020-10-06 DIAGNOSIS — R4701 Aphasia: Secondary | ICD-10-CM | POA: Insufficient documentation

## 2020-10-06 DIAGNOSIS — R4189 Other symptoms and signs involving cognitive functions and awareness: Secondary | ICD-10-CM | POA: Diagnosis present

## 2020-10-06 DIAGNOSIS — F801 Expressive language disorder: Secondary | ICD-10-CM | POA: Insufficient documentation

## 2020-10-06 LAB — GLUCOSE, CAPILLARY: Glucose-Capillary: 109 mg/dL — ABNORMAL HIGH (ref 70–99)

## 2020-10-06 MED ORDER — FLUDEOXYGLUCOSE F - 18 (FDG) INJECTION
10.0300 | Freq: Once | INTRAVENOUS | Status: AC | PRN
  Administered 2020-10-06: 10.03 via INTRAVENOUS

## 2020-10-08 ENCOUNTER — Other Ambulatory Visit: Payer: Self-pay | Admitting: Neurology

## 2020-10-08 ENCOUNTER — Telehealth: Payer: Self-pay | Admitting: Neurology

## 2020-10-08 DIAGNOSIS — F0390 Unspecified dementia without behavioral disturbance: Secondary | ICD-10-CM

## 2020-10-08 NOTE — Telephone Encounter (Signed)
Called and spoke to [place] today. Per Dr. Grier Rocher request Patient needs to have Neuro Psych testing done first .  Patient is scheduled Neuro Psych testing 10/12/2020 at 8:15 apt will last about four hours  With Dr. Jonelle Sidle cummings.  Office contact is [place] her direct line [phone] . 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 Rondel Oh on 10/12/2020.   [address] [place] [address] .   I have CX his apt with Dr. Sima Matas   PET BRAIN AMYLOID is usually not covered buy insurance . I called [facility] Pre service and they told me to run [phone]. I have sent per his insurance request and we should have a answer mid next week .   I have talked to patient he is aware of all details of his apt's , PET scan . I have also sent patient a message to his e-mail so he can location time and place . [facility] has sent him a e-mail new patient packet  Patient did receive . Thanks Hinton Dyer

## 2020-10-12 ENCOUNTER — Ambulatory Visit (HOSPITAL_COMMUNITY): Admission: RE | Admit: 2020-10-12 | Payer: No Typology Code available for payment source | Source: Ambulatory Visit

## 2020-10-13 ENCOUNTER — Telehealth: Payer: Self-pay | Admitting: Neurology

## 2020-10-13 NOTE — Telephone Encounter (Signed)
Approved PET Amyloid Ref # [facility] 10/13/2020 - 01/11/2021 . Patient is aware I called him Martin [name] is calling him to schedule .

## 2020-10-14 NOTE — Telephone Encounter (Signed)
Wow!!! That's great

## 2020-10-17 ENCOUNTER — Encounter: Payer: Self-pay | Admitting: Psychology

## 2020-10-17 NOTE — Progress Notes (Signed)
Neuropsychological Consultation   Patient:   Martin KRABILL MD   DOB:   09-Aug-1956  MR Number:  [id number]  Location:  [facility] [place], [place] [id number] [facility]  [place] [address] Dept: [phone]           Date of Service:   10/06/2020  Start Time:   11 AM End Time:   1 PM  Today's visit was a 2-hour total visit.  1 hour and 15 minutes was spent in clinical interview with the patient going over history with the other 30 to 45 minutes spent in records review, report writing and establishing testing regimen.  Provider/Observer:  Martin [name], Psy.D.       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 Jaynee [name] for neuropsychological evaluation as part of the larger neurological work-up being conducted.  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  The patient describes intermittent brain fog, word finding difficulties, slurred speech, throat and chest jittery sensations.  The patient describes word finding related to transposition of words in sentences and paraphasic errors.  The patient describes every now and then he will experience "brain fog" and have difficulty with concentration and memory and that the frequency and intensity of these events appears to be worsening and becoming more frequent.  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 Jaynee [name] for neuropsychological evaluation as part of the larger neurological work-up being conducted.  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  The patient describes intermittent brain fog, word finding difficulties, slurred speech,  throat and chest jittery sensations.  The patient describes word finding related to transposition of words in sentences and paraphasic errors.  The patient describes every now and then he will experience "brain fog" and have difficulty with concentration and memory and that the frequency and intensity of these events appears to be worsening and becoming more frequent.  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  He is having to be more careful and take time trying to think when he is talking and is experience increasing errors.  The patient denies any other significant medical issues other than some gastrointestinal issues.  He has had low B12 in the past but this is been corrected and other metabolic issues appear within normal limits.  The patient describes a feeling of jitteriness in his chest and other odd sensations in his chest such as smooth muscle changes.  The patient denies any geographic disorientation but is describing some executive functioning changes and when he thinks about something abstract that he can work through it like he used to.  The patient reports that these symptoms were initially very subtle and he began noticing something about 1 year ago when listening to the radio were trying to think about more complex issues the symptoms have been progressively worsening.  The patient reports that he goes to bed very early at night and feels like he gets a good night sleep.  He has  never been a night person and he sleeps regularly for 8 hours a night.  The patient had a recent MRI conducted on 01/13/2020 that was interpreted by Franchot Gallo, MD.  The radiologist impression of this MRI was normal MRI of brain with no indications of acute infarctions, hemorrhage, or other chronic ischemic changes.  The patient also had a PET metabolic brain study conducted on  10/06/2020.  This PET scan was interpreted by Suzy Bouchard, MD with the impression of subtle decreased relative cortical metabolism within the high parietal lobes (left greater than right) and was felt to be a pattern which could be indicative of Alzheimer's disease pathology.  Per recommendations, the patient has been scheduled for a beta amyloid PET brain imaging study.  Behavioral Observation: Martin Stairs MD  presents as a 65 y.o.-year-old Right handed Caucasian Male who appeared his stated age. his dress was Appropriate and he was Well Groomed and his manners were Appropriate to the situation.  his participation was indicative of Appropriate and Attentive behaviors.  There were not physical disabilities noted.  he displayed an appropriate level of cooperation and motivation.     Interactions:    Active Appropriate and Attentive  Attention:   within normal limits and attention span and concentration were age appropriate  Memory:   within normal limits; recent and remote memory intact  Visuo-spatial:  not examined  Speech (Volume):  normal  Speech:   normal; some mild word finding issues but overall he was able to produce fluent and effective verbal communication.  Thought Process:  Coherent and Relevant  Though Content:  WNL; not suicidal and not homicidal  Orientation:   person, place, time/date and situation  Judgment:   Good  Planning:   Good  Affect:    Anxious  Mood:    Anxious  Insight:   Good  Intelligence:   very high  Marital Status/Living: The patient was born and raised in [place] along with 1 brother.  The patient currently lives alone and is single and has no children.  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  The patient has recently stopped seeing patients because of concerns over his cognitive issues.  Hobbies and interests include tennis, church activities  and singing in the choir.  Substance Use:  No concerns of substance abuse are reported.    Education:   The patient performed very well through high school and college/post grad and graduated from medical school and maintained roughly 3.8 GPA throughout.  The patient attended [employer] in medical school.  Medical History:   Past Medical History:  Diagnosis Date  . C. difficile colitis 2010  . 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 Cognitive dysfunction   . GERD (gastroesophageal reflux disease)   . Hx of colonic polyp   . Hx of migraine headaches   . Ocular rosacea   . 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 Scoliosis   . Septic pulmonary embolism ([place]) 2010         Patient Active Problem List   Diagnosis Date Noted  . CLOSTRIDIUM DIFFICILE COLITIS 01/21/2009  . CANDIDIASIS OF THE ESOPHAGUS 01/21/2009  . NEUTROPENIA, DRUG-INDUCED 01/21/2009  . PYELONEPHRITIS 01/21/2009  . RENAL VEIN THROMBOSIS 01/12/2009  . OTHER DISEASES OF LUNG NOT ELSEWHERE CLASSIFIED 01/12/2009  . DYSPNEA 01/12/2009     Psychiatric History:  No  prior psychiatric history noted.  Family Med/Psych History:  Family History  Problem Relation Age of Onset  . Hypertension Mother   . 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 Jaynee [name] for neuropsychological evaluation as part of the larger neurological work-up being conducted.  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  The patient describes intermittent brain fog, word finding difficulties, slurred speech, throat and chest jittery sensations.  The patient describes word finding related to transposition of words in sentences and paraphasic errors.  The patient describes every now and then he will experience "brain fog" and have difficulty with concentration and memory and that the frequency and intensity  of these events appears to be worsening and becoming more frequent.  The patient is very concerned about the development of cognitive difficulties particularly around expressive language.  He has been concerned to the point of stopping to see patients actively.  Disposition/Plan:  We have set the patient for formal neuropsychological testing that will be conducted as soon as possible.  We will complete the Wechsler Adult Intelligence Scale-for as well as the Wechsler Memory Scale-IV to provide for a broad objective standardized test battery.  Once these 2 measures are completed a determination will be made as to whether pertinent measures particularly issues related to expressive language functioning.  Diagnosis:    Memory loss  Expressive language impairment         Electronically Signed   _______________________ Martin [name], Psy.D. Clinical Neuropsychologist

## 2020-10-21 ENCOUNTER — Telehealth: Payer: Self-pay | Admitting: Neurology

## 2020-10-21 NOTE — Telephone Encounter (Signed)
Called Patient's insurance got location switched to [facility] for his Amyloid Martin [name] is still valid [facility] 10/13/2020-01/11/2021  Juliann Pulse will call him to scheduled . Dr. Jaynee Eagles Has signed order  Telephone [phone] - Fax [phone] . Received ok conformation .  I Talked to Patient he is aware of details .     Dr. Jaynee Eagles Up Date you don't need to do anything thanks Hinton Dyer

## 2020-11-15 ENCOUNTER — Ambulatory Visit: Payer: No Typology Code available for payment source | Admitting: Psychology

## 2020-12-30 ENCOUNTER — Ambulatory Visit: Payer: No Typology Code available for payment source | Admitting: Psychology

## 2021-01-05 ENCOUNTER — Ambulatory Visit: Payer: No Typology Code available for payment source | Admitting: Psychology

## 2021-01-06 ENCOUNTER — Ambulatory Visit: Payer: No Typology Code available for payment source | Admitting: Psychology

## 2021-01-10 DIAGNOSIS — K529 Noninfective gastroenteritis and colitis, unspecified: Secondary | ICD-10-CM | POA: Diagnosis not present

## 2021-02-16 DIAGNOSIS — E559 Vitamin D deficiency, unspecified: Secondary | ICD-10-CM | POA: Diagnosis not present

## 2021-02-16 DIAGNOSIS — K529 Noninfective gastroenteritis and colitis, unspecified: Secondary | ICD-10-CM | POA: Diagnosis not present

## 2021-02-16 DIAGNOSIS — M8588 Other specified disorders of bone density and structure, other site: Secondary | ICD-10-CM | POA: Diagnosis not present

## 2021-02-16 DIAGNOSIS — G3184 Mild cognitive impairment, so stated: Secondary | ICD-10-CM | POA: Diagnosis not present

## 2021-02-17 ENCOUNTER — Other Ambulatory Visit: Payer: Self-pay | Admitting: Internal Medicine

## 2021-02-17 DIAGNOSIS — M8588 Other specified disorders of bone density and structure, other site: Secondary | ICD-10-CM

## 2021-02-19 ENCOUNTER — Ambulatory Visit
Admission: RE | Admit: 2021-02-19 | Discharge: 2021-02-19 | Disposition: A | Discharge status: Home / Self Care | Payer: PRIVATE HEALTH INSURANCE | Source: Ambulatory Visit | Attending: Internal Medicine | Admitting: Internal Medicine

## 2021-02-19 DIAGNOSIS — M8588 Other specified disorders of bone density and structure, other site: Secondary | ICD-10-CM

## 2021-02-19 DIAGNOSIS — M8589 Other specified disorders of bone density and structure, multiple sites: Secondary | ICD-10-CM | POA: Diagnosis not present

## 2021-02-19 DIAGNOSIS — Z13820 Encounter for screening for osteoporosis: Secondary | ICD-10-CM | POA: Diagnosis not present

## 2021-04-26 DIAGNOSIS — K529 Noninfective gastroenteritis and colitis, unspecified: Secondary | ICD-10-CM | POA: Diagnosis not present

## 2021-04-26 DIAGNOSIS — G3184 Mild cognitive impairment, so stated: Secondary | ICD-10-CM | POA: Diagnosis not present

## 2021-06-06 DIAGNOSIS — E7800 Pure hypercholesterolemia, unspecified: Secondary | ICD-10-CM | POA: Diagnosis not present

## 2021-06-06 DIAGNOSIS — K591 Functional diarrhea: Secondary | ICD-10-CM | POA: Diagnosis not present

## 2021-06-06 DIAGNOSIS — Z23 Encounter for immunization: Secondary | ICD-10-CM | POA: Diagnosis not present

## 2021-06-06 DIAGNOSIS — F09 Unspecified mental disorder due to known physiological condition: Secondary | ICD-10-CM | POA: Diagnosis not present

## 2021-06-06 DIAGNOSIS — R9431 Abnormal electrocardiogram [ECG] [EKG]: Secondary | ICD-10-CM | POA: Diagnosis not present

## 2021-06-06 DIAGNOSIS — Z0000 Encounter for general adult medical examination without abnormal findings: Secondary | ICD-10-CM | POA: Diagnosis not present

## 2021-06-06 DIAGNOSIS — I498 Other specified cardiac arrhythmias: Secondary | ICD-10-CM | POA: Diagnosis not present

## 2021-06-06 DIAGNOSIS — Z125 Encounter for screening for malignant neoplasm of prostate: Secondary | ICD-10-CM | POA: Diagnosis not present

## 2021-06-06 DIAGNOSIS — G43909 Migraine, unspecified, not intractable, without status migrainosus: Secondary | ICD-10-CM | POA: Diagnosis not present

## 2021-06-08 ENCOUNTER — Encounter (HOSPITAL_COMMUNITY): Payer: Self-pay

## 2021-06-08 ENCOUNTER — Encounter: Payer: Self-pay | Admitting: Neurology

## 2021-06-08 ENCOUNTER — Other Ambulatory Visit (HOSPITAL_COMMUNITY): Payer: Self-pay | Admitting: Internal Medicine

## 2021-06-08 ENCOUNTER — Ambulatory Visit: Payer: Self-pay | Admitting: Neurology

## 2021-06-08 VITALS — BP 101/66 | HR 56 | Ht 73.0 in | Wt 209.0 lb

## 2021-06-08 DIAGNOSIS — R4189 Other symptoms and signs involving cognitive functions and awareness: Secondary | ICD-10-CM

## 2021-06-08 DIAGNOSIS — I498 Other specified cardiac arrhythmias: Secondary | ICD-10-CM

## 2021-06-08 NOTE — Progress Notes (Signed)
[facility]    Provider:  Dr Jaynee Eagles Requesting Provider: [name], [name] Primary Care Provider:  [name], [name]  CC:  cognitive changes  HPI:  Martin [name] is a 65 y.o. male here as requested by [name], [name] for follow-up.  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  Extensive evaluation has been completed to this date, including nuclear medicine PET metabolic FDG scan, MRI of the brain with and without contrast, amyloid PET scan, formal neurocognitive testing, serum testing for reversible causes of his cognitive symptoms.  This week he reports he is feeling well, but he describes his symptoms as brain fog, like waking up from anesthesia, the symptoms wax and wane but at some point he felt like work was getting too much, it took a lot of effort to see patients and he retired.  Patient reported he significantly misses his profession and his patients tell me that they miss him quite a bit as well.  He has had GI problems since 2010 after a significant bout of pyelonephritis with septic pulmonary emboli, renal vein thrombosis and extended inpatient hospitalization.  He has been extensively evaluated by gastroenterology and likely carcinoid was ruled out but we continue to wonder about the gut brain association and the effect this might be having on his cognition.  He has had a sleep test, mild obstructive sleep apnea was diagnosed, today we discussed considering an in lab study which would be more accurate.  He has had low B12 in the past but supplements and follows his levels.  He sees Dr. Elyn Aquas. Bat[place] at [facility] and he has a follow-up appointment to discuss possibly joining the healthy brain study.  Overall his neurocognitive testing with Dr. Rondel Oh was reassuring, there were no abnormalities on his neuropsychological assessment, abnormalities on FDG PET scan with fairly mild and nonspecific  and not nearly enough evidence to suggest neurodegenerative disorder, amyloid PET scan was normal without evidence of amyloid plaques.  Dr. Rondel Oh did note that another possibility for his cognitive problems could be a systemic disorder that is not a primary neurologic problem but something that is causing the sensation of brain fog.  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  He has brain fog, like waking uo from anesthesia, everything is slower, work ws getting too much, took a lot of effort to see patients. He was ruled out for carcinoid. He has had a lot of GI problems since 2010 with pyelonephritis. He may have leaky gut, but it is controversial, he was a special diet, he is workingon his health, pre/probiotics,    Review of Systems: Patient complains of symptoms per HPI as well as the following symptoms brain fog. Pertinent negatives and positives per HPI. All others negative.   Social History   Socioeconomic History   Marital status: Single    Spouse name: Not on file   Number of children: Not on file   Years of education: Not on file   Highest education level: Professional school degree (e.g., [name], DDS, DVM, JD)  Occupational History    Comment: [name]  Tobacco Use   Smoking status: Never   Smokeless tobacco: Never  Vaping Use   Vaping Use: Never used  Substance and Sexual Activity   Alcohol use: No    Alcohol/week: 0.0 standard drinks   Drug use: No   Sexual activity:  Not on file  Other Topics Concern   Not on file  Social History Narrative   Not on file   Social Determinants of Health   Financial Resource Strain: Not on file  Food Insecurity: Not on file  Transportation Needs: Not on file  Physical Activity: Not on file  Stress: Not on file  Social Connections: Not on file  Intimate Partner Violence: Not on file    Family History  Problem Relation Age of Onset   Hypertension Mother    Heart attack Father      Past Medical History:  Diagnosis Date   C. 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[facility])    Scoliosis    Septic pulmonary embolism ([place]) 2010    Patient Active Problem List   Diagnosis Date Noted   CLOSTRIDIUM DIFFICILE COLITIS 01/21/2009   CANDIDIASIS OF THE ESOPHAGUS 01/21/2009   NEUTROPENIA, DRUG-INDUCED 01/21/2009   PYELONEPHRITIS 01/21/2009   RENAL VEIN THROMBOSIS 01/12/2009   OTHER DISEASES OF LUNG NOT ELSEWHERE CLASSIFIED 01/12/2009   DYSPNEA 01/12/2009    Past Surgical History:  Procedure Laterality Date   ANTERIOR CRUCIATE LIGAMENT REPAIR Right 1990   ELBOW SURGERY Right    MOHS SURGERY  1990   nose, basal cell   SHOULDER ACROMIOPLASTY Left 12/2014   shoulder arthroscopy  1995    Current Outpatient Medications  Medication Sig Dispense Refill   b complex vitamins tablet Take 1 tablet by mouth daily.     carisoprodol (SOMA) 350 MG tablet Take 350 mg by mouth 2 (two) times daily as needed.     Cholecalciferol (VITAMIN D3 PO) Take 800 Units by mouth daily.     FA-D3-Mg Cit-Acetylcyst-Ca Cit (FOLITE PO) Take 1 mg by mouth daily.     finasteride (PROSCAR) 5 MG tablet Take 5 mg by mouth daily.     HYDROcodone-acetaminophen (NORCO) 10-325 MG tablet Take by mouth as needed.     Multiple Vitamin (MULTIVITAMIN) capsule Take 1 capsule by mouth daily.     multivitamin-lutein (OCUVITE-LUTEIN) CAPS capsule Take 1 capsule by mouth daily.     rosuvastatin (CRESTOR) 10 MG tablet Take 10 mg by mouth at bedtime.     vitamin E 180 MG (400 UNITS) capsule Take 800 Units by mouth daily.     No current facility-administered medications for this visit.    Allergies as of 06/08/2021 - Review Complete 06/08/2021  Allergen Reaction Noted   Vancomycin      Vitals: BP 101/66   Pulse  (!) 56   Ht 6\' 1"  (1.854 m)   Wt 209 lb (94.8 kg)   BMI 27.57 kg/m  Last Weight:  Wt Readings from Last 1 Encounters:  06/08/21 209 lb (94.8 kg)   Last Height:   Ht Readings from Last 1 Encounters:  06/08/21 6\' 1"  (1.854 m)   Exam: NAD, pleasant                  Speech:    Speech is normal; fluent and spontaneous with normal comprehension.  Cognition:    The patient is oriented to person, place, and time;     recent and remote memory intact;     language fluent;    Cranial Nerves:    The  pupils are equal, round, and reactive to light.Trigeminal sensation is intact and the muscles of mastication are normal. The face is symmetric. The palate elevates in the midline. Hearing intact. Voice is normal. Shoulder shrug is normal. The tongue has normal motion without fasciculations.   Coordination:  No dysmetria  Motor Observation:    No asymmetry, no atrophy, and no involuntary movements noted. Tone:    Normal muscle tone.     Strength:    Strength is V/V in the upper and lower limbs.      Sensation: intact to LT     Assessment/Plan:  65 y.o. male here as requested by [name], [name] for follow-up.  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  Extensive evaluation has been completed to this date, including nuclear medicine PET metabolic FDG scan, MRI of the brain with and without contrast, amyloid PET scan, formal neurocognitive testing, serum testing for reversible causes of his cognitive symptoms. He describes his symptoms as brain fog, like waking up from anesthesia, the symptoms wax and wane but at some point he felt like work was getting too much, it took a lot of effort to see patients and he retired.  Patient reported he significantly misses his profession and his patients tell me that they miss him quite a bit as well.  He has had GI problems since 2010 after a significant bout of pyelonephritis with  septic pulmonary emboli, renal vein thrombosis and extended inpatient hospitalization.  He has been extensively evaluated by gastroenterology  but we continue to wonder about the gut brain association and the effect this might be having on his cognition.    - Overall his neurocognitive testing was reassuring, there were no abnormalities on his neuropsychological assessment, abnormalities on FDG PET scan with fairly mild and nonspecific and not nearly enough evidence to suggest neurodegenerative disorder, amyloid PET scan was normal without evidence of amyloid plaques.    - He has had a sleep test, mild obstructive sleep apnea was diagnosed, today we discussed considering an in lab study which would be more accurate.   - He has had low B12 in the past but supplements and follows his levels.   - He sees Dr. Elyn Aquas. Rondel Oh at [facility] and he has a follow-up appointment to discuss possibly joining the healthy brain study.   - Dr. Rondel Oh did note that another possibility for his cognitive problems could be a systemic disorder that is not a primary neurologic problem but something that is causing the sensation of brain fog.   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   - Since he is feeling better, I encouraged him to continue with his current diet, focusing on prebiotic's and probiotics and general health and wellbeing.   Cc: [name], [name],  [name], [name]  Sarina Ill, [name]  [facility] [address] [place], [place] [address]  Phone [phone] Fax [phone]  No Charge

## 2021-06-13 ENCOUNTER — Ambulatory Visit: Payer: PPO | Admitting: Neurology

## 2021-06-14 DIAGNOSIS — R4189 Other symptoms and signs involving cognitive functions and awareness: Secondary | ICD-10-CM | POA: Diagnosis not present

## 2021-06-14 DIAGNOSIS — Z881 Allergy status to other antibiotic agents status: Secondary | ICD-10-CM | POA: Diagnosis not present

## 2021-06-16 ENCOUNTER — Other Ambulatory Visit: Payer: Self-pay

## 2021-06-16 ENCOUNTER — Ambulatory Visit (INDEPENDENT_AMBULATORY_CARE_PROVIDER_SITE_OTHER): Payer: PPO

## 2021-06-16 DIAGNOSIS — R0600 Dyspnea, unspecified: Secondary | ICD-10-CM

## 2021-06-16 DIAGNOSIS — I498 Other specified cardiac arrhythmias: Secondary | ICD-10-CM

## 2021-06-17 LAB — ECHOCARDIOGRAM COMPLETE
Area-P 1/2: 2.69 cm2
Calc EF: 68.4 %
S' Lateral: 3.18 cm
Single Plane A2C EF: 66.3 %
Single Plane A4C EF: 68.8 %

## 2021-07-11 DIAGNOSIS — G4733 Obstructive sleep apnea (adult) (pediatric): Secondary | ICD-10-CM | POA: Diagnosis not present

## 2021-07-19 ENCOUNTER — Telehealth: Payer: Self-pay

## 2021-07-19 NOTE — Telephone Encounter (Signed)
NOTES SCANNED TO REFERRAL 

## 2021-07-21 DIAGNOSIS — G4710 Hypersomnia, unspecified: Secondary | ICD-10-CM | POA: Diagnosis not present

## 2021-08-01 DIAGNOSIS — I498 Other specified cardiac arrhythmias: Secondary | ICD-10-CM | POA: Insufficient documentation

## 2021-08-08 ENCOUNTER — Institutional Professional Consult (permissible substitution): Payer: PPO | Admitting: Internal Medicine

## 2021-08-08 DIAGNOSIS — I498 Other specified cardiac arrhythmias: Secondary | ICD-10-CM

## 2021-09-06 ENCOUNTER — Encounter: Payer: Self-pay | Admitting: Radiology

## 2021-09-06 ENCOUNTER — Ambulatory Visit: Payer: PPO | Admitting: Internal Medicine

## 2021-09-06 ENCOUNTER — Other Ambulatory Visit: Payer: Self-pay

## 2021-09-06 ENCOUNTER — Encounter: Payer: Self-pay | Admitting: Internal Medicine

## 2021-09-06 VITALS — BP 120/76 | HR 72 | Ht 73.0 in | Wt 205.8 lb

## 2021-09-06 DIAGNOSIS — I493 Ventricular premature depolarization: Secondary | ICD-10-CM

## 2021-09-06 DIAGNOSIS — I498 Other specified cardiac arrhythmias: Secondary | ICD-10-CM

## 2021-09-06 NOTE — Progress Notes (Deleted)
ELECTROPHYSIOLOGY CONSULT NOTE  Patient ID: Martin [name], MRN: [id number], DOB/AGE: Feb 28, 1956 65 y.o. Admit date: (Not on file) Date of Consult: 09/06/2021  Primary Physician: [name], [name] Primary Cardiologist: ***     Martin [name] is a 65 y.o. male who is being seen today for the evaluation of *** at the request of ***.    HPI Martin [name] is a 65 y.o. male ***  DATE TEST EF   10/22 Echo  55-60 %         Date Cr K Hgb  ***/*** *** *** ***            Past Medical History:  Diagnosis Date   Adenomatous colon polyp    Allergic rhinitis    Benign neoplasm of colon    C. 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    GERD (gastroesophageal reflux disease)    Hx of colonic polyp    Hx of migraine headaches    Hypercholesterolemia    Migraine    Mild cognitive impairment    Mild sleep apnea    Ocular rosacea    Renal vein thrombosis (H[facility])    Scoliosis    Septic pulmonary embolism ([place]) 2010   Vasomotor rhinitis    Ventricular bigeminy       Surgical History:  Past Surgical History:  Procedure Laterality Date   ANTERIOR CRUCIATE LIGAMENT REPAIR Right 1990   ELBOW SURGERY Right    MOHS SURGERY  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   carisoprodol (SOMA) 350 MG tablet Take 350 mg by mouth 2 (two) times daily as needed.   Cholecalciferol (VITAMIN D3 PO) Take 800 Units by mouth daily.   FA-D3-Mg Cit-Acetylcyst-Ca Cit (FOLITE PO) Take 1 mg by mouth daily.   finasteride (PROSCAR) 5 MG tablet Take 5 mg by mouth daily.   HYDROcodone-acetaminophen (NORCO) 10-325 MG tablet Take by mouth as needed.   Multiple Vitamin  (MULTIVITAMIN) capsule Take 1 capsule by mouth daily.   multivitamin-lutein (OCUVITE-LUTEIN) CAPS capsule Take 1 capsule by mouth daily.   rosuvastatin (CRESTOR) 10 MG tablet Take 10 mg by mouth at bedtime.   traZODone (DESYREL) 150 MG tablet Take 150 mg by mouth at bedtime.   vitamin B-12 (CYANOCOBALAMIN) 100 MCG tablet Take 100 mcg by mouth daily.   VITAMIN D, ERGOCALCIFEROL, PO Take 25 mcg by mouth.   vitamin E 180 MG (400 UNITS) capsule Take 800 Units by mouth daily.   Vitamin Mixture (VITAMIN E COMPLETE) CAPS Take by mouth.    Allergies:  Allergies  Allergen Reactions   Vancomycin     REACTION: neutropenia    Social History   Socioeconomic History   Marital status: Single    Spouse name: Not on file   Number of children: Not on file   Years of education: Not on file   Highest education level: Professional school degree (e.g., [name], DDS, DVM, JD)  Occupational History    Comment: [name]  Tobacco Use   Smoking status: Never   Smokeless tobacco: Never  Vaping Use  Vaping Use: Never used  Substance and Sexual Activity   Alcohol use: No    Alcohol/week: 0.0 standard drinks   Drug use: No   Sexual activity: Not on file  Other Topics Concern   Not on file  Social History Narrative   Not on file   Social Determinants of Health   Financial Resource Strain: Not on file  Food Insecurity: Not on file  Transportation Needs: Not on file  Physical Activity: Not on file  Stress: Not on file  Social Connections: Not on file  Intimate Partner Violence: Not on file     Family History  Problem Relation Age of Onset   Hypertension Mother    Heart attack Father      ROS:  Please see the history of present illness.   {ros [id number]}  All other systems reviewed and negative.    Physical Exam:*** Blood pressure 120/76, pulse 72, height 6\' 1"  (1.854 m), weight 205 lb 12.8 oz (93.4 kg), SpO2 98 %. General: Well developed, well nourished male in no acute distress. Head:  Normocephalic, atraumatic, sclera non-icteric, no xanthomas, nares are without discharge. EENT: normal  Lymph Nodes:  none Neck: Negative for carotid bruits. JVD not elevated. Back:without scoliosis kyphosis*** Lungs: Clear bilaterally to auscultation without wheezes, rales, or rhonchi. Breathing is unlabored. Heart: RRR with S1 S2. No *** ***/6 systolic*** murmur . No rubs, or gallops appreciated. Abdomen: Soft, non-tender, non-distended with normoactive bowel sounds. No hepatomegaly. No rebound/guarding. No obvious abdominal masses. Msk:  Strength and tone appear normal for age. Extremities: No clubbing or cyanosis. No*** ***+*** edema.  Distal pedal pulses are 2+ and equal bilaterally. Skin: Warm and Dry Neuro: Alert and oriented X 3. CN III-XII intact Grossly normal sensory and motor function . Psych:  Responds to questions appropriately with a normal affect.      Labs: Cardiac Enzymes No results for input(s): CKTOTAL, CKMB, TROPONINI in the last 72 hours. CBC Lab Results  Component Value Date   WBC 6.6 01/21/2009   HGB 13.4 01/21/2009   HCT 38.5 01/21/2009   MCV 89.0 01/21/2009   PLT 192 01/21/2009   PROTIME: No results for input(s): LABPROT, INR in the last 72 hours. Chemistry No results for input(s): NA, K, CL, CO2, BUN, CREATININE, CALCIUM, PROT, BILITOT, ALKPHOS, ALT, AST, GLUCOSE in the last 168 hours.  Invalid input(s): LABALBU Lipids No results found for: CHOL, HDL, LDLCALC, TRIG BNP No results found for: PROBNP Thyroid Function Tests: No results for input(s): TSH, T4TOTAL, T3FREE, THYROIDAB in the last 72 hours.  Invalid input(s): FREET3 Miscellaneous No results found for: DDIMER  Radiology/Studies:  No results found.  EKG: ***   Assessment and Plan: *** Virl Axe

## 2021-09-06 NOTE — Progress Notes (Signed)
ELECTROPHYSIOLOGY CONSULT NOTE  Patient ID: Martin Stairs MD, MRN: [id number], DOB/AGE: 66/23/1957 66 y.o. Admit date: (Not on file) Date of Consult: 09/06/2021  Primary Physician: [name], MD Primary Cardiologist: new     Martin Stairs MD is a 65 y.o. male who is being seen today for the evaluation of EKG, ventricular bigeminy at the request of Dr. [name] MD.    HPI Martin Stairs MD is a 65 y.o. male with the history of celiac artery dissection, hypercholesterolemia, and GERD.   DATE TEST EF   10/22 Echo  55-60 % LVH 28mm         Date Cr K Hgb  9/22 0.77 4.5 14.9          Today, He appears well, but he states that he has been having problems with palpations    He has fluttering in his upper chest which he experiences once a week that have been around for a year or 2, typically short-lived.  Caffeine makes his palpitations worse.   He was noted on his physical to have bigeminy.  He is unaware of it most of the time.  Has noted no impact on his exercise tolerance.  He has 2 dogs that he walks daily up to 30 miles a week.  He had sleep apnea that was negative, and his diet changed recently. He states that his Brain fog improved since he has been on his diet. He has been taking vitamins supplements for Vitamin B12 and Vitamin D.  Echocardiogram demonstrated normal LV function but also LVH at 13 mm.  No history of hypertension and a negative sleep evaluation  The patient denies shortness of breath, orthopnea, or peripheral edema.  There have been no lightheadedness or syncope.  Complains of palpitations and PVCs.  Of note, he also experiences waxing and waning of brain fog with an onset of three years which prompted him to retire.   Has been seen at [facility] for brain fog.>>Dr. Rex Kras is currently working on the hypothesis that he has "leaky gut syndrome" and he has moved towards a FODMAP diet, which, remarkably, has led to resolution of his brain fog symptoms.  He is approaching this with the skepticism of a physician and is a bit surprised himself that this has worked, and is cautiously optimistic (taken from the note of Dr. Georgetta Haber neurology [facility]) <<  Also seen by [facility] for "cognitive decline "which according to Dr. 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 Rondel Oh was reassuring, there were no abnormalities on neuropsychological assessment, abnormalities on FDG-PET "  History of GI problems since 2010 following a significant bout of polynephritis with septic pulmonary emboli renal vein thrombosis and extended inpatient hospitalization.   Marland Kitchen  He does not drink or smoke      Past Medical History:  Diagnosis Date   Adenomatous colon polyp    Allergic rhinitis    Benign neoplasm of colon    C. 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    GERD (gastroesophageal reflux disease)    Hx of colonic polyp    Hx of migraine headaches    Hypercholesterolemia    Migraine    Mild cognitive  impairment    Mild sleep apnea    Ocular rosacea    Renal vein thrombosis (HCC)    Scoliosis    Septic pulmonary embolism ([place]) 2010   Vasomotor rhinitis    Ventricular bigeminy       Surgical History:  Past Surgical History:  Procedure Laterality Date   ANTERIOR CRUCIATE LIGAMENT REPAIR Right 1990   ELBOW SURGERY Right    MOHS SURGERY  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   carisoprodol (SOMA) 350 MG tablet Take 350 mg by mouth 2 (two) times daily as needed.   Cholecalciferol (VITAMIN D3 PO) Take 800 Units by mouth  daily.   FA-D3-Mg Cit-Acetylcyst-Ca Cit (FOLITE PO) Take 1 mg by mouth daily.   finasteride (PROSCAR) 5 MG tablet Take 5 mg by mouth daily.   HYDROcodone-acetaminophen (NORCO) 10-325 MG tablet Take by mouth as needed.   Multiple Vitamin (MULTIVITAMIN) capsule Take 1 capsule by mouth daily.   multivitamin-lutein (OCUVITE-LUTEIN) CAPS capsule Take 1 capsule by mouth daily.   rosuvastatin (CRESTOR) 10 MG tablet Take 10 mg by mouth at bedtime.   traZODone (DESYREL) 150 MG tablet Take 150 mg by mouth at bedtime.   vitamin B-12 (CYANOCOBALAMIN) 100 MCG tablet Take 100 mcg by mouth daily.   VITAMIN D, ERGOCALCIFEROL, PO Take 25 mcg by mouth.   vitamin E 180 MG (400 UNITS) capsule Take 800 Units by mouth daily.   Vitamin Mixture (VITAMIN E COMPLETE) CAPS Take by mouth.    Allergies:  Allergies  Allergen Reactions   Vancomycin     REACTION: neutropenia    Social History   Socioeconomic History   Marital status: Single    Spouse name: Not on file   Number of children: Not on file   Years of education: Not on file   Highest education level: Professional school degree (e.g., MD, DDS, DVM, JD)  Occupational History    Comment: MD  Tobacco Use   Smoking status: Never   Smokeless tobacco: Never  Vaping Use   Vaping Use: Never used  Substance and Sexual Activity   Alcohol use: No    Alcohol/week: 0.0 standard drinks   Drug use: No   Sexual activity: Not on file  Other Topics Concern   Not on file  Social History Narrative   Not on file   Social Determinants of Health   Financial Resource Strain: Not on file  Food Insecurity: Not on file  Transportation Needs: Not on file  Physical Activity: Not on file  Stress: Not on file  Social Connections: Not on file  Intimate Partner Violence: Not on file     Family History  Problem Relation Age of Onset   Hypertension Mother    Heart attack Father     Family history: His brother has Atrial tachycardia and Bigeminy.  ROS:   Please see the history of present illness.     All other systems reviewed and negative.   Please see the history of present illness. (+) Palpitations (upper chest flutters) (+) Headaches/migraines (+) Weight loss (+) PVCs (+) Brain Fog  (+) Diarrhea All other systems are reviewed and negative.     Physical Exam:  Blood pressure 120/76, pulse 72, height 6\' 1"  (1.854 m), weight 205 lb 12.8 oz (93.4 kg), SpO2 98 %.  General: Well developed, well nourished male in no acute distress. Head: Normocephalic, atraumatic, sclera non-icteric, no xanthomas, nares are without discharge. EENT: normal  Lymph Nodes:  none Neck: Negative for carotid bruits. JVD not elevated. Back:without scoliosis kyphosis  Lungs: Clear bilaterally to auscultation without wheezes, rales, or rhonchi. Breathing is unlabored. Heart: Regularly irregular RR with S1 S2. No murmur . No rubs, or gallops appreciated. Abdomen: Soft, non-tender, non-distended with normoactive bowel sounds. No hepatomegaly. No rebound/guarding. No obvious abdominal masses. Msk:  Strength and tone appear normal for age. Extremities: No clubbing or cyanosis. No  edema.  Distal pedal pulses are 2+ and equal bilaterally. Skin: Warm and Dry Neuro: Alert and oriented X 3. CN III-XII intact Grossly normal sensory and motor function . Psych:  Responds to questions appropriately with a normal affect.      Labs: Cardiac Enzymes No results for input(s): CKTOTAL, CKMB, TROPONINI in the last 72 hours. CBC Lab Results  Component Value Date   WBC 6.6 01/21/2009   HGB 13.4 01/21/2009   HCT 38.5 01/21/2009   MCV 89.0 01/21/2009   PLT 192 01/21/2009    02/04/2020 CT CHEST: CONTRAST:  136mL OMNIPAQUE IOHEXOL 300 MG/ML  SOLN   COMPARISON:  CT abdomen 11/09/2015   FINDINGS: CT CHEST FINDINGS Cardiovascular: No significant vascular findings. Normal heart size. No pericardial effusion.   Mediastinum/Nodes: No axillary supraclavicular adenopathy.  Several small thyroid nodules are less than 10 mm. No mediastinal adenopathy. Esophagus normal.   Lungs/Pleura: No suspicious nodularity. Scattered pleural nodularity is favored benign.   Musculoskeletal: No aggressive osseous lesion.  IMPRESSION: 1. No evidence of thoracic malignancy. 2. No lymphadenopathy. 3. No suspicious pulmonary nodularity. 4. Small thyroid nodules. Not clinically significant; no follow-up imaging recommended (ref: J Am Coll Radiol. 2015 Feb;12(2): 143-50). 5. Normal liver and pancreas.  Normal biliary tree. 6. Benign cyst of the LEFT kidney. 7.  Aortic Atherosclerosis (ICD10-I70.0).   EKG: Sinus 59 Interval 16/10/44 Axis 56 PVCs right bundle superior axis   Assessment and Plan:  Ventricular bigeminy right bundle superior axis  Tachy palpitations  Left ventricular hypertrophy  Brain fog question question  cause  Question dumping syndrome   The patient has bigeminy noted over the last few months in the context of palpitations noted over the last couple years.  I am not sure that they are not the same as he is largely unaware of his bigeminy today.  They appear to be right bundle superior axis suggesting an origin in the floor of the left ventricle perhaps related to papillary muscle or the fascicular area.  In the setting of ventricular tachycardia these have been described as verapamil sensitive.  Implications of PVCs related to impact on cardiac performance as well as symptoms.  It is not clear that he has symptoms although PVCs and when akin to pacemaker syndrome might well be associated with transmitter releases that could be aggravating cerebral perfusion of cerebral performance.  We discussed the potential role of drug suppression either using calcium blockers or antiarrhythmic drugs.  We will hold off on that for now.  The the timing of the PVCs becoming manifest correlates with his new diet.  The flutters antedate that.  Will quantitate the  PVCs as well as looking for the cause of his increasingly less frequent tachy flutters.  We will use a 30-day event recorder.  Undertake treadmill testing to look for both a) relationship of ventricular ectopy with exercise and b) morphological changes  Confirmed with coronary disease.  We will undertake a calcium score.  He is already on a statin.     F/U in   about 3 months    I,Tinashe Williams,acting as a scribe for Virl Axe, MD.,have documented all relevant documentation on the behalf of Virl Axe, MD,as directed by  Virl Axe, MD while in the presence of Virl Axe, MD.   I, Virl Axe, MD, have reviewed all documentation for this visit. The documentation on 09/06/21 for the exam, diagnosis, procedures, and orders are all accurate and complete.

## 2021-09-06 NOTE — Patient Instructions (Addendum)
Medication Instructions:  Your physician recommends that you continue on your current medications as directed. Please refer to the Current Medication list given to you today.  *If you need a refill on your cardiac medications before your next appointment, please call your pharmacy*   Lab Work: None ordered.  If you have labs (blood work) drawn today and your tests are completely normal, you will receive your results only by: [place] (if you have MyChart) OR A paper copy in the mail If you have any lab test that is abnormal or we need to change your treatment, we will call you to review the results.   Testing/Procedures:  Please schedule when Dr Caryl Comes is in the office an place pt on his schedule as well to a lot for time. Your physician has requested that you have an exercise tolerance test. For further information please visit [email or web address]. Please also follow instruction sheet, as given.   Your physician has recommended that you wear an event monitor. Event monitors are medical devices that record the hearts electrical activity. Doctors most often [place] these monitors to diagnose arrhythmias. Arrhythmias are problems with the speed or rhythm of the heartbeat. The monitor is a small, portable device. You can wear one while you do your normal daily activities. This is usually used to diagnose what is causing palpitations/syncope (passing out).   Dr Caryl Comes would like for you to schedule a Calcium Score CT.   Follow-Up: At [facility], you and your health needs are our priority.  As part of our continuing mission to provide you with exceptional heart care, we have created designated Provider Care Teams.  These Care Teams include your primary Cardiologist (physician) and Advanced Practice Providers (APPs -  Physician Assistants and Nurse Practitioners) who all work together to provide you with the care you need, when you need it.  We recommend signing up for the patient portal  called "MyChart".  Sign up information is provided on this After Visit Summary.  MyChart is used to connect with patients for Virtual Visits (Telemedicine).  Patients are able to view lab/test results, encounter notes, upcoming appointments, etc.  Non-urgent messages can be sent to your provider as well.   To learn more about what you can do with MyChart, go to [email or web address].    Your next appointment:   Follow up in 3 months with Dr Caryl Comes  [employer] Cardiac Event Monitor Instructions Your physician has requested you wear your cardiac event monitor for ____30_ days, (1-30). [employer] may call or text to confirm a shipping address. The monitor will be sent to a land address via UPS. [employer] will not ship a monitor to a PO BOX. It typically takes 3-5 days to receive your monitor after it has been enrolled. [employer] will assist with USPS tracking if your package is delayed. The telephone number for [employer] is [phone]. Once you have received your monitor, please review the enclosed instructions. Instruction tutorials can also be viewed under help and settings on the enclosed cell phone. Your monitor has already been registered assigning a specific monitor serial # to you.  Applying the monitor Remove cell phone from case and turn it on. The cell phone works as Dealer and needs to be within [employer] of you at all times. The cell phone will need to be charged on a daily basis. We recommend you plug the cell phone into the enclosed charger at your bedside table every night.  Monitor batteries: You will receive two monitor  batteries labelled #1 and #2. These are your recorders. Plug battery #2 onto the second connection on the enclosed charger. Keep one battery on the charger at all times. This will keep the monitor battery deactivated. It will also keep it fully charged for when you need to switch your monitor batteries. A small light will be blinking on the  battery emblem when it is charging. The light on the battery emblem will remain on when the battery is fully charged.  Open package of a Monitor strip. Insert battery #1 into black hood on strip and gently squeeze monitor battery onto connection as indicated in instruction booklet. Set aside while preparing skin.  Choose location for your strip, vertical or horizontal, as indicated in the instruction booklet. Shave to remove all hair from location. There cannot be any lotions, oils, powders, or colognes on skin where monitor is to be applied. Wipe skin clean with enclosed Saline wipe. Dry skin completely.  Peel paper labeled #1 off the back of the Monitor strip exposing the adhesive. Place the monitor on the chest in the vertical or horizontal position shown in the instruction booklet. One arrow on the monitor strip must be pointing upward. Carefully remove paper labeled #2, attaching remainder of strip to your skin. Try not to create any folds or wrinkles in the strip as you apply it.  Firmly press and release the circle in the center of the monitor battery. You will hear a small beep. This is turning the monitor battery on. The heart emblem on the monitor battery will light up every 5 seconds if the monitor battery in turned on and connected to the patient securely. Do not push and hold the circle down as this turns the monitor battery off. The cell phone will locate the monitor battery. A screen will appear on the cell phone checking the connection of your monitor strip. This may read poor connection initially but change to good connection within the next minute. Once your monitor accepts the connection you will hear a series of 3 beeps followed by a climbing crescendo of beeps. A screen will appear on the cell phone showing the two monitor strip placement options. Touch the picture that demonstrates where you applied the monitor strip.  Your monitor strip and battery are waterproof. You  are able to shower, bathe, or swim with the monitor on. They just ask you do not submerge deeper than 3 feet underwater. We recommend removing the monitor if you are swimming in a lake, river, or ocean.  Your monitor battery will need to be switched to a fully charged monitor battery approximately once a week. The cell phone will alert you of an action which needs to be made.  On the cell phone, tap for details to reveal connection status, monitor battery status, and cell phone battery status. The green dots indicates your monitor is in good status. A red dot indicates there is something that needs your attention.  To record a symptom, click the circle on the monitor battery. In 30-60 seconds a list of symptoms will appear on the cell phone. Select your symptom and tap save. Your monitor will record a sustained or significant arrhythmia regardless of you clicking the button. Some patients do not feel the heart rhythm irregularities. [employer] will notify us of any serious or critical events.  Refer to instruction booklet for instructions on switching batteries, changing strips, the Do not disturb or Pause features, or any additional questions.  Call [employer] at [phone],  to confirm your monitor is transmitting and record your baseline. They will answer any questions you may have regarding the monitor instructions at that time.  Returning the monitor to [place] all equipment back into blue box. Peel off strip of paper to expose adhesive and close box securely. There is a prepaid UPS shipping label on this box. Drop in a UPS drop box, or at a UPS facility like [employer]. You may also contact [employer] to arrange UPS to pick up monitor package at your home.

## 2021-09-06 NOTE — Progress Notes (Signed)
Enrolled patient for a 30 day [employer] Event Monitor to be mailed to patients home  

## 2021-09-24 ENCOUNTER — Ambulatory Visit (INDEPENDENT_AMBULATORY_CARE_PROVIDER_SITE_OTHER): Payer: PPO

## 2021-09-24 DIAGNOSIS — I498 Other specified cardiac arrhythmias: Secondary | ICD-10-CM

## 2021-09-24 DIAGNOSIS — I493 Ventricular premature depolarization: Secondary | ICD-10-CM

## 2021-09-30 DIAGNOSIS — R140 Abdominal distension (gaseous): Secondary | ICD-10-CM | POA: Diagnosis not present

## 2021-09-30 DIAGNOSIS — K529 Noninfective gastroenteritis and colitis, unspecified: Secondary | ICD-10-CM | POA: Diagnosis not present

## 2021-10-04 ENCOUNTER — Telehealth: Payer: Self-pay | Admitting: Internal Medicine

## 2021-10-04 NOTE — Telephone Encounter (Signed)
Attempted to contact pt again.  Still no answer.

## 2021-10-04 NOTE — Telephone Encounter (Signed)
° °  Cardiac Monitor Alert  Date of alert:  10/04/2021   Patient Name: GAY RAPE MD  DOB: May 06, 1956  MRN: [id number]   [facility] Cardiologist: None  [facility] EP:  Dr. Virl Axe    Monitor Information: Cardiac Event Monitor [[employer]]  Reason:  Palpitations Ordering provider:  Dr. Virl Axe   Alert Atrial Fibrillation/Flutter This is the 1st alert for this rhythm.  The patient has no hx of Atrial Fibrillation/Flutter.  The patient is not currently on anticoagulation.  Next Cardiology Appointment   Date:  1/31/213  Provider:  Dr. Caryl Comes  The patient could NOT be reached by telephone today.  No VM set up, so unable to leave message. Spoke with Dr. 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    Other:   Loren Racer, RN  10/04/2021 4:34 PM

## 2021-10-04 NOTE — Progress Notes (Signed)
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  Seen 10/05/21.  Patient notes that he is doing OK.   Yesterday at 10/04/21, he had potentially asymptomatic atrial fibrillation. Has Fasciular PVCs in the pattern of bigeminy and when he has symptoms of palpitations it is unclear the etiology.  No chest pain or pressure .  No SOB/DOE and no PND/Orthopnea.  No weight gain or leg swelling.   Has intermittent brain fog.  Has had significant testing for this.  Didn't have this during AF  Past Medical History:  Diagnosis Date   Adenomatous colon polyp    Allergic rhinitis    Benign neoplasm of colon    C. 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    GERD (gastroesophageal reflux disease)    Hx of colonic polyp    Hx of migraine headaches    Hypercholesterolemia    Migraine    Mild cognitive impairment    Mild sleep apnea    Ocular rosacea    Renal vein thrombosis (HCC)    Scoliosis    Septic pulmonary embolism ([place]) 2010   Vasomotor rhinitis    Ventricular bigeminy     Past Surgical History:  Procedure Laterality Date   ANTERIOR CRUCIATE LIGAMENT REPAIR Right 1990   ELBOW SURGERY Right    MOHS SURGERY  1990   nose, basal cell   SHOULDER ACROMIOPLASTY Left 12/2014   shoulder arthroscopy  1995    Current Medications: Current Meds  Medication Sig   carisoprodol (SOMA) 350 MG tablet Take 350 mg by mouth 2 (two) times daily as needed.   Cholecalciferol (VITAMIN D3  PO) Take 800 Units by mouth daily.   finasteride (PROSCAR) 5 MG tablet Take 5 mg by mouth daily.   HYDROcodone-acetaminophen (NORCO) 10-325 MG tablet Take by mouth as needed.   Multiple Vitamin (MULTIVITAMIN) capsule Take 1 capsule by mouth daily.   multivitamin-lutein (OCUVITE-LUTEIN) CAPS capsule Take 1 capsule by mouth daily.   naproxen sodium (ALEVE) 220 MG tablet Take 220 mg by mouth.   rosuvastatin (CRESTOR) 10 MG tablet Take 10 mg by mouth at bedtime.   traZODone (DESYREL) 150 MG tablet Take 150 mg by mouth at bedtime.   vitamin B-12 (CYANOCOBALAMIN) 100 MCG tablet Take 100 mcg by mouth daily.   [DISCONTINUED] b complex vitamins tablet Take 1 tablet by mouth daily.   [DISCONTINUED] co-enzyme Q-10 30 MG capsule Take 30 mg by mouth 3 (three) times daily.   [DISCONTINUED] FA-D3-Mg Cit-Acetylcyst-Ca Cit (FOLITE PO) Take 1 mg by mouth daily.   [DISCONTINUED] VITAMIN D, ERGOCALCIFEROL, PO Take 25 mcg by mouth.   [DISCONTINUED] vitamin E 180 MG (400 UNITS) capsule Take 800 Units by mouth daily.   [DISCONTINUED] Vitamin Mixture (VITAMIN E COMPLETE) CAPS Take by mouth.     Allergies:   Vancomycin   Social History   Socioeconomic  History   Marital status: Single    Spouse name: Not on file   Number of children: Not on file   Years of education: Not on file   Highest education level: Professional school degree (e.g., MD, DDS, DVM, JD)  Occupational History    Comment: MD  Tobacco Use   Smoking status: Never   Smokeless tobacco: Never  Vaping Use   Vaping Use: Never used  Substance and Sexual Activity   Alcohol use: No    Alcohol/week: 0.0 standard drinks   Drug use: No   Sexual activity: Not on file  Other Topics Concern   Not on file  Social History Narrative   Not on file   Social Determinants of Health   Financial Resource Strain: Not on file  Food Insecurity: Not on file  Transportation Needs: Not on file  Physical Activity: Not on file  Stress: Not on file  Social  Connections: Not on file     Family History: The patient's family history includes Heart attack in his father; Hypertension in his mother.  ROS:   Please see the history of present illness.     All other systems reviewed and are negative.  EKGs/Labs/Other Studies Reviewed:    The following studies were reviewed today:   EKG:  EKG is  ordered today.  The ekg ordered today demonstrates  10/05/21:  SR rate 67 monomorphic PVCs in the pattern of bigeminy  Recent Labs: No results found for requested labs within last 8760 hours.  Recent Lipid Panel No results found for: CHOL, TRIG, HDL, CHOLHDL, VLDL, LDLCALC, LDLDIRECT        Physical Exam:    VS:  BP 109/68    Pulse 68    Ht 6\' 2"  (1.88 m)    Wt 92.1 kg    SpO2 98%    BMI 26.06 kg/m     Wt Readings from Last 3 Encounters:  10/05/21 92.1 kg  09/06/21 93.4 kg  06/08/21 94.8 kg     GEN:  Well nourished, well developed in no acute distress HEENT: Normal NECK: No JVD CARDIAC: RRR, no murmurs, rubs, gallops, bigeminy felt on radial artery palpation RESPIRATORY:  Clear to auscultation without rales, wheezing or rhonchi  ABDOMEN: Soft, non-tender, non-distended MUSCULOSKELETAL:  No edema; No deformity  SKIN: Warm and dry NEUROLOGIC:  Alert and oriented x 3 PSYCHIATRIC:  Normal affect   ASSESSMENT:    1. Atrial fibrillation, unspecified type ([place])   2. Ventricular bigeminy    PLAN:    Paroxysmal Atrial Fibrillation (small burden) PVCs in bigeminy  - we discussed the Kardia device to help him distinguish between these two rhythms - we discussed starting verapamil in the setting of preserved LVEF, patient notes that he has septal wall thickness of 14 mm and mod concentric hypertrophy, no long standing hx of HTN, and asked about eval for infiltrative disease or HCM; has no family history of HCM, no classic findings for amyloid - not unreasonable, but agree with getting his other testing done first. - reviewed Echo and  [facility] report with patient  Has schedule EP follow up      Medication Adjustments/Labs and Tests Ordered: Current medicines are reviewed at length with the patient today.  Concerns regarding medicines are outlined above.  Orders Placed This Encounter  Procedures   EKG 12-Lead   No orders of the defined types were placed in this encounter.   Patient Instructions  Medication Instructions:  Your physician recommends that  you continue on your current medications as directed. Please refer to the Current Medication list given to you today.  *If you need a refill on your cardiac medications before your next appointment, please call your pharmacy*   Lab Work: NONE If you have labs (blood work) drawn today and your tests are completely normal, you will receive your results only by: [place] (if you have MyChart) OR A paper copy in the mail If you have any lab test that is abnormal or we need to change your treatment, we will call you to review the results.   Testing/Procedures: NONE   Follow-Up: At [facility], you and your health needs are our priority.  As part of our continuing mission to provide you with exceptional heart care, we have created designated Provider Care Teams.  These Care Teams include your primary Cardiologist (physician) and Advanced Practice Providers (APPs -  Physician Assistants and Nurse Practitioners) who all work together to provide you with the care you need, when you need it.   Provider:   Dr. Caryl Comes       Signed, Werner Lean, MD  10/05/2021 5:26 PM    [place]

## 2021-10-04 NOTE — Telephone Encounter (Signed)
Spoke with pt reviewed monitor with him.  No complaints at time noted on monitor.  Pt agreeable to come for EKG tomorrow.  Scheduled pt to see Dr. Gasper Sells tomorrow.    Per conversation with Dr. Caryl Comes earlier- if pt is not in AF/Aflutter for 24 hours, would not anticoagulate at this time.

## 2021-10-05 ENCOUNTER — Other Ambulatory Visit: Payer: Self-pay

## 2021-10-05 ENCOUNTER — Ambulatory Visit: Payer: PPO | Admitting: Internal Medicine

## 2021-10-05 ENCOUNTER — Encounter: Payer: Self-pay | Admitting: Internal Medicine

## 2021-10-05 VITALS — BP 109/68 | HR 68 | Ht 74.0 in | Wt 203.0 lb

## 2021-10-05 DIAGNOSIS — I4891 Unspecified atrial fibrillation: Secondary | ICD-10-CM

## 2021-10-05 DIAGNOSIS — I498 Other specified cardiac arrhythmias: Secondary | ICD-10-CM | POA: Diagnosis not present

## 2021-10-05 DIAGNOSIS — I480 Paroxysmal atrial fibrillation: Secondary | ICD-10-CM | POA: Insufficient documentation

## 2021-10-05 NOTE — Patient Instructions (Signed)
Medication Instructions:  Your physician recommends that you continue on your current medications as directed. Please refer to the Current Medication list given to you today.  *If you need a refill on your cardiac medications before your next appointment, please call your pharmacy*   Lab Work: NONE If you have labs (blood work) drawn today and your tests are completely normal, you will receive your results only by: [place] (if you have MyChart) OR A paper copy in the mail If you have any lab test that is abnormal or we need to change your treatment, we will call you to review the results.   Testing/Procedures: NONE   Follow-Up: At [facility], you and your health needs are our priority.  As part of our continuing mission to provide you with exceptional heart care, we have created designated Provider Care Teams.  These Care Teams include your primary Cardiologist (physician) and Advanced Practice Providers (APPs -  Physician Assistants and Nurse Practitioners) who all work together to provide you with the care you need, when you need it.   Provider:   Dr. Caryl Comes

## 2021-10-11 ENCOUNTER — Ambulatory Visit (INDEPENDENT_AMBULATORY_CARE_PROVIDER_SITE_OTHER): Payer: PPO | Admitting: Internal Medicine

## 2021-10-11 ENCOUNTER — Ambulatory Visit: Payer: PPO

## 2021-10-11 ENCOUNTER — Ambulatory Visit (INDEPENDENT_AMBULATORY_CARE_PROVIDER_SITE_OTHER)
Admission: RE | Admit: 2021-10-11 | Discharge: 2021-10-11 | Disposition: A | Discharge status: Home / Self Care | Payer: Self-pay | Source: Ambulatory Visit | Attending: Internal Medicine | Admitting: Internal Medicine

## 2021-10-11 ENCOUNTER — Other Ambulatory Visit: Payer: Self-pay

## 2021-10-11 DIAGNOSIS — I4891 Unspecified atrial fibrillation: Secondary | ICD-10-CM

## 2021-10-11 DIAGNOSIS — I493 Ventricular premature depolarization: Secondary | ICD-10-CM

## 2021-10-11 DIAGNOSIS — I498 Other specified cardiac arrhythmias: Secondary | ICD-10-CM

## 2021-10-11 LAB — EXERCISE TOLERANCE TEST
Estimated workload: 7
Exercise duration (min): 3 min
Exercise duration (sec): 30 s
MPHR: 154 {beats}/min
Peak HR: 155 {beats}/min
Percent HR: 100 %
Rest HR: 73 {beats}/min

## 2021-10-11 MED ORDER — VERAPAMIL HCL ER 120 MG PO TBCR: 120.0000 mg | EXTENDED_RELEASE_TABLET | Freq: Every day | ORAL | 3 refills | Status: DC

## 2021-10-11 NOTE — Patient Instructions (Signed)
Medication Instructions:  Your physician has recommended you make the following change in your medication:   Begin Verapamil 120mg  - 1 tablet by mouth daily at bedtime.  *If you need a refill on your cardiac medications before your next appointment, please call your pharmacy*   Lab Work: None ordered.  If you have labs (blood work) drawn today and your tests are completely normal, you will receive your results only by: [place] (if you have MyChart) OR A paper copy in the mail If you have any lab test that is abnormal or we need to change your treatment, we will call you to review the results.   Testing/Procedures: None ordered.    Follow-Up: At [facility], you and your health needs are our priority.  As part of our continuing mission to provide you with exceptional heart care, we have created designated Provider Care Teams.  These Care Teams include your primary Cardiologist (physician) and Advanced Practice Providers (APPs -  Physician Assistants and Nurse Practitioners) who all work together to provide you with the care you need, when you need it.  We recommend signing up for the patient portal called "MyChart".  Sign up information is provided on this After Visit Summary.  MyChart is used to connect with patients for Virtual Visits (Telemedicine).  Patients are able to view lab/test results, encounter notes, upcoming appointments, etc.  Non-urgent messages can be sent to your provider as well.   To learn more about what you can do with MyChart, go to [email or web address].    Your next appointment:   2-3 months with Dr Caryl Comes - I will have his scheduler call you.

## 2021-10-11 NOTE — Progress Notes (Signed)
ELECTROPHYSIOLOGY CONSULT NOTE  Patient ID: Martin Stairs MD, MRN: [id number], DOB/AGE: 1956/03/29 66 y.o. 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    HPI   DATE TEST EF   10/22 Echo  55-60 % LVH 15mm         Date Cr K Hgb  9/22 0.77 4.5 14.9          During his previous visit, He appeared well, but he states that he has been having problems with palpations    He had fluttering in his upper chest which he experiences once a week that have been around for a year or 2, typically short-lived.  Caffeine makes his palpitations worse.   He was noted on his physical to have bigeminy.  He is unaware of it most of the time.  Has noted no impact on his exercise tolerance.  He has 2 dogs that he walks daily up to 30 miles a week.  He had sleep apnea that was negative, and his diet changed recently. He states that his Brain fog improved since he has been on his diet. He has been taking vitamins supplements for Vitamin B12 and Vitamin D.  Echocardiogram demonstrated normal LV function but also LVH at 13 mm.  No history of hypertension and a negative sleep evaluation  The patient denies shortness of breath, orthopnea, or peripheral edema.  There have been no lightheadedness or syncope.  Complains of palpitations and PVCs.  Of note, he also experiences waxing and waning of brain fog with an onset of three years which prompted him to retire.   Has been seen at [facility] for brain fog.>>Dr. Rex Kras is currently working on the hypothesis that he has "leaky gut syndrome" and he has moved towards a FODMAP diet, which, remarkably, has led to resolution of his brain fog symptoms. He is approaching this with the skepticism of a physician and is a bit surprised  himself that this has worked, and is cautiously optimistic (taken from the note of Dr. Georgetta Haber neurology [facility]) <<  Also seen by [facility] for "cognitive decline "which according to Dr. 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 Rondel Oh was reassuring, there were no abnormalities on neuropsychological assessment, abnormalities on FDG-PET "  History of GI problems since 2010 following a significant bout of polynephritis with septic pulmonary emboli renal vein thrombosis and extended inpatient hospitalization.     Past Medical History:  Diagnosis Date   Adenomatous colon polyp    Allergic rhinitis    Benign neoplasm of colon    C. 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    GERD (gastroesophageal reflux disease)    Hx of colonic polyp    Hx of migraine headaches    Hypercholesterolemia    Migraine    Mild cognitive impairment    Mild sleep apnea    Ocular rosacea    Renal vein thrombosis (HCC)    Scoliosis    Septic  pulmonary embolism ([place]) 2010   Vasomotor rhinitis    Ventricular bigeminy       Surgical History:  Past Surgical History:  Procedure Laterality Date   ANTERIOR CRUCIATE LIGAMENT REPAIR Right 1990   ELBOW SURGERY Right    MOHS SURGERY  1990   nose, basal cell   SHOULDER ACROMIOPLASTY Left 12/2014   shoulder arthroscopy  1995     Home Meds: Current Meds  Medication Sig   verapamil (CALAN-SR) 120 MG CR tablet Take 1 tablet (120 mg total) by mouth at bedtime.    Allergies:  Allergies  Allergen Reactions   Vancomycin     REACTION: neutropenia    Social History   Socioeconomic History   Marital status: Single    Spouse name: Not on file   Number of children: Not on file    Years of education: Not on file   Highest education level: Professional school degree (e.g., MD, DDS, DVM, JD)  Occupational History    Comment: MD  Tobacco Use   Smoking status: Never   Smokeless tobacco: Never  Vaping Use   Vaping Use: Never used  Substance and Sexual Activity   Alcohol use: No    Alcohol/week: 0.0 standard drinks   Drug use: No   Sexual activity: Not on file  Other Topics Concern   Not on file  Social History Narrative   Not on file   Social Determinants of Health   Financial Resource Strain: Not on file  Food Insecurity: Not on file  Transportation Needs: Not on file  Physical Activity: Not on file  Stress: Not on file  Social Connections: Not on file  Intimate Partner Violence: Not on file     Family History  Problem Relation Age of Onset   Hypertension Mother    Heart attack Father     Family history: His brother has Atrial tachycardia and Bigeminy.   ROS: Please see the history of present illness.   All other systems are reviewed and negative.     Physical Exam:  There were no vitals taken for this visit. General: Well developed, well nourished male in no acute distress. Head: Normocephalic, atraumatic, sclera non-icteric, no xanthomas, nares are without discharge. EENT: normal  Lymph Nodes:  none Neck: Negative for carotid bruits. JVD not elevated. Back:without scoliosis kyphosis  Lungs: Clear bilaterally to auscultation without wheezes, rales, or rhonchi. Breathing is unlabored. Heart: Regularly irregular RR with S1 S2. No murmur . No rubs, or gallops appreciated. Abdomen: Soft, non-tender, non-distended with normoactive bowel sounds. No hepatomegaly. No rebound/guarding. No obvious abdominal masses. Msk:  Strength and tone appear normal for age. Extremities: No clubbing or cyanosis. No  edema.  Distal pedal pulses are 2+ and equal bilaterally. Skin: Warm and Dry Neuro: Alert and oriented X 3. CN III-XII intact Grossly normal  sensory and motor function . Psych:  Responds to questions appropriately with a normal affect.      Labs: Cardiac Enzymes No results for input(s): CKTOTAL, CKMB, TROPONINI in the last 72 hours. CBC Lab Results  Component Value Date   WBC 6.6 01/21/2009   HGB 13.4 01/21/2009   HCT 38.5 01/21/2009   MCV 89.0 01/21/2009   PLT 192 01/21/2009    02/04/2020 CT CHEST: CONTRAST:  156mL OMNIPAQUE IOHEXOL 300 MG/ML  SOLN   COMPARISON:  CT abdomen 11/09/2015   FINDINGS: CT CHEST FINDINGS Cardiovascular: No significant vascular findings. Normal heart size. No pericardial effusion.   Mediastinum/Nodes: No axillary supraclavicular adenopathy.  Several small thyroid nodules are less than 10 mm. No mediastinal adenopathy. Esophagus normal.   Lungs/Pleura: No suspicious nodularity. Scattered pleural nodularity is favored benign.   Musculoskeletal: No aggressive osseous lesion.  IMPRESSION: 1. No evidence of thoracic malignancy. 2. No lymphadenopathy. 3. No suspicious pulmonary nodularity. 4. Small thyroid nodules. Not clinically significant; no follow-up imaging recommended (ref: J Am Coll Radiol. 2015 Feb;12(2): 143-50). 5. Normal liver and pancreas.  Normal biliary tree. 6. Benign cyst of the LEFT kidney. 7.  Aortic Atherosclerosis (ICD10-I70.0).     09/06/21 ECG: Sinus 59 Interval 16/10/44 Axis 56 PVCs right bundle superior axis   Assessment and Plan:  Ventricular bigeminy right bundle superior axis  Tachy palpitations  Left ventricular hypertrophy  Brain fog question question  cause  Question dumping syndrome     With atrial fib and PVC will try verapamil 120

## 2021-11-02 DIAGNOSIS — L739 Follicular disorder, unspecified: Secondary | ICD-10-CM | POA: Diagnosis not present

## 2021-11-02 DIAGNOSIS — Z85828 Personal history of other malignant neoplasm of skin: Secondary | ICD-10-CM | POA: Diagnosis not present

## 2021-11-02 DIAGNOSIS — D485 Neoplasm of uncertain behavior of skin: Secondary | ICD-10-CM | POA: Diagnosis not present

## 2021-11-16 ENCOUNTER — Telehealth: Payer: Self-pay | Admitting: Internal Medicine

## 2021-11-16 NOTE — Telephone Encounter (Signed)
Pt is requesting a refill on verapamil 120 mg tablets. Pt states that he takes this medication 4 times daily. These directions are not on pt's medication. Would Dr. Caryl Comes like to reorder this medication as pt taking verapamil 120 mg tablets taking 4 times daily? Please address ?

## 2021-11-16 NOTE — Telephone Encounter (Signed)
Pt c/o medication issue: ? ?1. Name of Medication: verapamil (CALAN-SR) 120 MG CR tablet ? ? ?2. How are you currently taking this medication (dosage and times per day)? Take 1 tablet (120 mg total) by mouth 4 times a day. ? ?3. Are you having a reaction (difficulty breathing--STAT)? no ? ?4. What is your medication issue? Pt is needing 100 pills until he is due to come in on the 31st... needs rx that reflects this dosage... please advise  ? ?

## 2021-11-17 MED ORDER — VERAPAMIL HCL ER 120 MG PO TBCR: 120.0000 mg | EXTENDED_RELEASE_TABLET | Freq: Two times a day (BID) | ORAL | 1 refills | Status: DC

## 2021-11-17 NOTE — Telephone Encounter (Signed)
Pt aware Dr. Caryl Comes agreeable to Verapamil 120 mg BID. ?Pt agreeable to plan. ? ?

## 2021-12-06 DIAGNOSIS — R002 Palpitations: Secondary | ICD-10-CM | POA: Insufficient documentation

## 2021-12-09 ENCOUNTER — Encounter: Payer: Self-pay | Admitting: Internal Medicine

## 2021-12-09 ENCOUNTER — Ambulatory Visit (INDEPENDENT_AMBULATORY_CARE_PROVIDER_SITE_OTHER): Payer: PPO | Admitting: Internal Medicine

## 2021-12-09 VITALS — BP 122/60 | HR 58 | Ht 74.0 in | Wt 217.8 lb

## 2021-12-09 DIAGNOSIS — I498 Other specified cardiac arrhythmias: Secondary | ICD-10-CM

## 2021-12-09 DIAGNOSIS — R002 Palpitations: Secondary | ICD-10-CM | POA: Diagnosis not present

## 2021-12-09 DIAGNOSIS — I480 Paroxysmal atrial fibrillation: Secondary | ICD-10-CM | POA: Diagnosis not present

## 2021-12-09 NOTE — Patient Instructions (Addendum)
Medication Instructions:  ?You have been given 3 different prescriptions to try. You may take them in any order. Please allow at least 2 weeks on each medication before changing.  DO NOT take more than one of these prescriptions at a time. ?1) Metoprolol Tartrate 50 mg twice daily ?2) Atenolol 50 mg once daily ?3) Bisoprolol 5 mg once daily ? ?*If you need a refill on your cardiac medications before your next appointment, please call your pharmacy* ? ? ?Lab Work: ?None ordered ? ? ?Testing/Procedures: ?Your physician has requested that you have a cardiac MRI. Cardiac MRI uses a computer to create images of your heart as its beating, producing both still and moving pictures of your heart and major blood vessels. For further information please visit [email or web address] Please follow the instruction sheet given to you today for more information. ? ? ?Follow-Up: ?At [facility], you and your health needs are our priority.  As part of our continuing mission to provide you with exceptional heart care, we have created designated Provider Care Teams.  These Care Teams include your primary Cardiologist (physician) and Advanced Practice Providers (APPs -  Physician Assistants and Nurse Practitioners) who all work together to provide you with the care you need, when you need it. ? ?Your next appointment:   ?6 month(s) ? ?The format for your next appointment:   ?In Person ? ?Provider:   ?Virl Axe, MD ? ? ? ?Thank you for choosing [facility]!! ? ? ?[phone] ? ? ?Other Instructions ? ?  ?

## 2021-12-09 NOTE — Progress Notes (Signed)
? ? ? ? 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 ?Admit date: (Not on file) ?Date of Consult: 12/09/2021 ? ?Primary Physician: [name], MD ?Primary Cardiologist: new ?  ? 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  Subsequently identified as having new atrial fibrillation also noted to have left ventricular hypertrophy the absence of hypertension. ? ?He carries a diagnosis of "leaky gut syndrome "and has moved towards a FODMAP diet with significant improvement.  Has had significant cognitive decline.  He has subsequently stopped the diet because it did not really help.  His cognitive issues are little bit better. ? ?He has been started on verapamil and uptitrated to 120 twice daily, he has been taking  ? ?The patient denies chest pain, shortness of breath, nocturnal dyspnea, orthopnea or peripheral edema.  There have been no lightheadedness or syncope.   ? ?DATE TEST EF   ?10/22 Echo  55-60 % LVH 85m   ?2/23 CaScore  0  ? ?Date Cr K Hgb  ?9/22 0.77 4.5 14.9  ?      ? ?Date PVCs  ?2/23 17%  ?     ? ? ? .  ? ? ? ?Past Medical History:  ?Diagnosis Date  ? Adenomatous colon polyp   ? Allergic rhinitis   ? Benign neoplasm of colon   ? C. difficile colitis 2010  ? 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 Cervical spondylosis   ? Chronic diarrhea   ? Cognitive dysfunction   ? 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 GERD (gastroesophageal reflux disease)   ? Hx of colonic polyp   ? Hx of migraine headaches   ? Hypercholesterolemia   ? Migraine   ? Mild cognitive impairment   ? Mild sleep apnea   ? Ocular rosacea   ? Renal vein thrombosis ([place]   ? Scoliosis   ? Septic pulmonary embolism ([place]  ? Vasomotor rhinitis   ? Ventricular bigeminy   ?   ? ?Surgical History:  ?Past Surgical History:   ?Procedure Laterality Date  ? ANTERIOR CRUCIATE LIGAMENT REPAIR Right 1990  ? [place]GERY Right   ? [place] ? nose, basal cell  ? SHOULDER ACROMIOPLASTY Left 12/2014  ? shoulder arthroscopy  1995  ?  ? ?Home Meds: ?Current Meds  ?Medication Sig  ? carisoprodol (SOMA) 350 MG tablet Take 350 mg by mouth 2 (two) times daily as needed.  ? Cholecalciferol (VITAMIN D3 PO) Take 800 Units by mouth daily.  ? finasteride (PROSCAR) 5 MG tablet Take 5 mg by mouth daily.  ? HYDROcodone-acetaminophen (NORCO) 10-325 MG tablet Take by mouth as needed (AS DIRECTED).  ? Multiple Vitamin (MULTIVITAMIN) capsule Take 1 capsule by mouth daily.  ? multivitamin-lutein (OCUVITE-LUTEIN) CAPS capsule Take 1 capsule by mouth daily.  ? naproxen sodium (ALEVE) 220 MG tablet Take 220 mg by mouth.  ? rosuvastatin (CRESTOR) 10 MG tablet Take 10 mg by mouth at bedtime.  ? traZODone (DESYREL) 150 MG tablet Take 150 mg by mouth at bedtime.  ? verapamil (CALAN-SR) 120 MG CR tablet Take 1 tablet (120 mg total) by mouth 2 (two) times daily.  ? vitamin B-12 (CYANOCOBALAMIN) 100 MCG tablet Take 100 mcg by mouth daily.  ? ? ?  Allergies:  ?Allergies  ?Allergen Reactions  ? Vancomycin   ?  REACTION: neutropenia  ? ? ? ? ?  ? ? ?ROS: ?Please see the history of present illness. ?  ?All other systems are reviewed and negative.   ? ?Physical examination  ?BP 122/60   Pulse (!) 58   Ht '6\' 2"'$  (1.88 m)   Wt 217 lb 12.8 oz (98.8 kg)   SpO2 95%   BMI 27.96 kg/m?   ?Well developed and nourished in no acute distress ?HENT normal ?Neck supple with JVP-  flat  ?Clear ?Regular rate and rhythm, no murmurs or gallops ?Abd-soft with active BS ?No Clubbing cyanosis edema ?Skin-warm and dry ?A & Oriented  Grossly normal sensory and motor function ? ?ECG sinus at 58 ?Interval 16/10/42 ?PVCs right bundle superior axis-frequent 4/31 ? ?Labs: ?Cardiac Enzymes ?No results for input(s): CKTOTAL, CKMB, TROPONINI in the last 72 hours. ?CBC ?Lab Results  ?Component  Value Date  ? WBC 6.6 01/21/2009  ? HGB 13.4 01/21/2009  ? HCT 38.5 01/21/2009  ? MCV 89.0 01/21/2009  ? PLT 192 01/21/2009  ? ? ?02/04/2020 CT CHEST: ?CONTRAST:  156m OMNIPAQUE IOHEXOL 300 MG/ML  SOLN ?  ?COMPARISON:  CT abdomen 11/09/2015 ?  ?FINDINGS: ?CT CHEST FINDINGS ?Cardiovascular: No significant vascular findings. Normal heart size. ?No pericardial effusion. ?  ?Mediastinum/Nodes: No axillary supraclavicular adenopathy. Several ?small thyroid nodules are less than 10 mm. No mediastinal ?adenopathy. Esophagus normal. ?  ?Lungs/Pleura: No suspicious nodularity. Scattered pleural nodularity ?is favored benign. ?  ?Musculoskeletal: No aggressive osseous lesion. ? ?IMPRESSION: ?1. No evidence of thoracic malignancy. ?2. No lymphadenopathy. ?3. No suspicious pulmonary nodularity. ?4. Small thyroid nodules. Not clinically significant; no follow-up ?imaging recommended (ref: J Am Coll Radiol. 2015 Feb;12(2): 143-50). ?5. Normal liver and pancreas.  Normal biliary tree. ?6. Benign cyst of the LEFT kidney. ?7.  Aortic Atherosclerosis (ICD10-I70.0). ? ?  ? ?09/06/21 ECG: Sinus 59 ?Interval 16/10/44 ?Axis 56 ?PVCs right bundle superior axis ? ? ?Assessment and Plan:  ?Ventricular bigeminy right bundle superior axis ? ?Tachy palpitations ? ?Left ventricular hypertrophy ? ?Brain fog question question  cause ? ?Question dumping syndrome ? ? ?No interval atrial fibrillation.  He will use his cardia monitor daily, if he is in atrial fibrillation 2 mornings in a row he will begin on Eliquis.  We will give him samples so he has this on hand.  We will begin 5 mg twice daily. ? ?PVCs continue to be problematic.  No real response to verapamil.  We will stop it.  I have given him prescriptions today for atenolol 50, metoprolol succinate 50, and bisoprolol 5.  We will take him in random order trying to see if he can find one that attenuates his PVCs without causing other side effects. ? ?He has left ventricular hypertrophy in the  absence of hypertension.  Concerned about phenocopy's of HCM.  We will undertake cMRI scanning.  I [facility] also [facility] the constellation of relatively new onset multisystem issues including his cognitive issues GI issues and cardiac issues both electrically and morphologically. ? ? ?

## 2021-12-13 ENCOUNTER — Ambulatory Visit: Payer: PPO | Admitting: Internal Medicine

## 2021-12-15 ENCOUNTER — Telehealth: Payer: Self-pay | Admitting: Physician Assistant

## 2021-12-15 NOTE — Telephone Encounter (Signed)
Scheduled appt per 4/5 referral. Pt is aware of appt date and time. Pt is aware to arrive 15 mins prior to appt time and to bring and updated insurance card. Pt is aware of appt location.   ?

## 2021-12-16 ENCOUNTER — Inpatient Hospital Stay: Payer: PPO

## 2021-12-16 ENCOUNTER — Encounter: Payer: Self-pay | Admitting: Physician Assistant

## 2021-12-16 ENCOUNTER — Inpatient Hospital Stay: Payer: PPO | Attending: Physician Assistant | Admitting: Physician Assistant

## 2021-12-16 ENCOUNTER — Other Ambulatory Visit: Payer: Self-pay

## 2021-12-16 ENCOUNTER — Telehealth: Payer: Self-pay | Admitting: Internal Medicine

## 2021-12-16 VITALS — BP 137/70 | HR 38 | Temp 97.3°F | Resp 20 | Wt 215.3 lb

## 2021-12-16 DIAGNOSIS — R140 Abdominal distension (gaseous): Secondary | ICD-10-CM | POA: Diagnosis not present

## 2021-12-16 DIAGNOSIS — K529 Noninfective gastroenteritis and colitis, unspecified: Secondary | ICD-10-CM | POA: Diagnosis not present

## 2021-12-16 DIAGNOSIS — I4891 Unspecified atrial fibrillation: Secondary | ICD-10-CM | POA: Insufficient documentation

## 2021-12-16 DIAGNOSIS — R000 Tachycardia, unspecified: Secondary | ICD-10-CM | POA: Diagnosis not present

## 2021-12-16 DIAGNOSIS — Q249 Congenital malformation of heart, unspecified: Secondary | ICD-10-CM

## 2021-12-16 DIAGNOSIS — R002 Palpitations: Secondary | ICD-10-CM | POA: Diagnosis not present

## 2021-12-16 LAB — CMP (CANCER CENTER ONLY)
ALT: 18 U/L (ref 0–44)
AST: 21 U/L (ref 15–41)
Albumin: 4.3 g/dL (ref 3.5–5.0)
Alkaline Phosphatase: 85 U/L (ref 38–126)
Anion gap: 7 (ref 5–15)
BUN: 18 mg/dL (ref 8–23)
CO2: 27 mmol/L (ref 22–32)
Calcium: 9.3 mg/dL (ref 8.9–10.3)
Chloride: 106 mmol/L (ref 98–111)
Creatinine: 0.95 mg/dL (ref 0.61–1.24)
GFR, Estimated: >60 mL/min (ref >60)
Glucose, Bld: 97 mg/dL (ref 70–99)
Potassium: 4.5 mmol/L (ref 3.5–5.1)
Sodium: 140 mmol/L (ref 135–145)
Total Bilirubin: 0.7 mg/dL (ref 0.3–1.2)
Total Protein: 7.3 g/dL (ref 6.5–8.1)

## 2021-12-16 LAB — CBC WITH DIFFERENTIAL (CANCER CENTER ONLY)
Abs Immature Granulocytes: 0.01 10*3/uL (ref 0.00–0.07)
Basophils Absolute: 0 10*3/uL (ref 0.0–0.1)
Basophils Relative: 1 %
Eosinophils Absolute: 0.1 10*3/uL (ref 0.0–0.5)
Eosinophils Relative: 2 %
HCT: 47.4 % (ref 39.0–52.0)
Hemoglobin: 15.8 g/dL (ref 13.0–17.0)
Immature Granulocytes: 0 %
Lymphocytes Relative: 21 %
Lymphs Abs: 1.2 10*3/uL (ref 0.7–4.0)
MCH: 31.1 pg (ref 26.0–34.0)
MCHC: 33.3 g/dL (ref 30.0–36.0)
MCV: 93.3 fL (ref 80.0–100.0)
Monocytes Absolute: 0.6 10*3/uL (ref 0.1–1.0)
Monocytes Relative: 10 %
Neutro Abs: 3.7 10*3/uL (ref 1.7–7.7)
Neutrophils Relative %: 66 %
Platelet Count: 202 10*3/uL (ref 150–400)
RBC: 5.08 MIL/uL (ref 4.22–5.81)
RDW: 13.1 % (ref 11.5–15.5)
WBC Count: 5.5 10*3/uL (ref 4.0–10.5)
nRBC: 0 % (ref 0.0–0.2)

## 2021-12-16 LAB — LACTATE DEHYDROGENASE: LDH: 149 U/L (ref 98–192)

## 2021-12-16 NOTE — Telephone Encounter (Signed)
Pt reports that he actually spoke with MRI scheduling and they were able to move MRI to next Friday. ?He appreciates the return call. ?

## 2021-12-16 NOTE — Progress Notes (Signed)
?[facility] ?[place] ?[phone]   [phone] ? ?INITIAL CONSULTATION: ? ?Patient Care Team: ?[name], [name] as PCP - General (Internal Medicine) ?[name], [name] as Referring Physician (Internal Medicine) ? ?CHIEF COMPLAINTS/PURPOSE OF CONSULTATION:  ?Evaluate for AL amyloidosis ? ?HISTORY OF PRESENTING ILLNESS:  ?Martin [name] 66 y.o. male presents to the diagnostic clinic to evaluate for possible AL amyloidosis. Patient is unaccompanied for this visit.  ? 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 He reports that approximately five years ago, he started to have GI symptoms with chronic diarrhea and bloating. He has gone through extensive testing including EGD and colonoscopy in 2021 that was unremarkable. He then developed a feeling of "brain fog" over the last 3 years which has worsened over time.She has been evaluated by neurology without any abnormalities detected on his neuropsychological assessment. Recently in the last couple of years, he has developed various cardiac symptoms including ventricular bigeminy right bundle superior axis, tachy palpitations, left ventricular hypertrophy and atrial fibrillation. Patient is scheduled for a cardiac MRI on 12/23/2021.  ? 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 He reports a poor appetite and his weight fluctuates. He denies nausea or vomiting. He has chronic diarrhea that requires taking imodium frequently. He denies easy bruising or signs of active bleeding. He has dyspnea on exertion with a dry cough. 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 He denies fevers, chills, sweats, chest pain, rash or edema. He has no other complaints. Rest of the 10 point ROS is below.  ? ?MEDICAL HISTORY:  ?Past Medical History:  ?Diagnosis Date  ? Adenomatous colon polyp   ? Allergic rhinitis   ? Benign neoplasm of colon   ? C. difficile colitis 2010   ? 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 Cervical spondylosis   ? Chronic diarrhea   ? Cognitive dysfunction   ? 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 GERD (gastroesophageal reflux disease)   ? Hx of colonic polyp   ? Hx of migraine headaches   ? Hypercholesterolemia   ? Migraine   ? Mild cognitive impairment   ? Mild sleep apnea   ? Ocular rosacea   ? 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 Scoliosis   ? Septic pulmonary embolism ([place]) 2010  ? Vasomotor rhinitis   ? Ventricular bigeminy   ? ? ?SURGICAL HISTORY: ?Past Surgical History:  ?Procedure Laterality Date  ? ANTERIOR CRUCIATE LIGAMENT REPAIR Right 1990  ? ELBOW SURGERY Right   ? [place]  ? nose, basal cell  ? SHOULDER ACROMIOPLASTY Left 12/2014  ? shoulder arthroscopy  1995  ? ? ?SOCIAL HISTORY: ?Social History  ? ?Socioeconomic History  ? Marital status: Single  ?  Spouse name: Not on file  ? Number of children: Not on file  ? Years of education: Not on file  ? Highest education level: Professional school degree (e.g., [name], DDS, DVM, JD)  ?Occupational History  ?  Comment: [name]  ?Tobacco Use  ? Smoking status: Never  ?  Passive exposure: Never  ? Smokeless tobacco: Never  ?Vaping Use  ? Vaping Use: Never used  ?Substance and Sexual Activity  ? Alcohol use: No  ?  Alcohol/week: 0.0 standard drinks  ? Drug use: No  ? Sexual activity: Not on file  ?Other Topics Concern  ? Not on file  ?Social  History Narrative  ? Not on file  ? ?Social Determinants of Health  ? ?Financial Resource Strain: Not on file  ?Food Insecurity: Not on file  ?Transportation Needs: Not on file  ?Physical Activity: Not on file  ?Stress: Not on file  ?Social Connections: Not on file  ?Intimate Partner Violence: Not on file  ? ? ?FAMILY HISTORY: ?Family History  ?Problem Relation Age of Onset  ? Hypertension Mother   ? Heart attack Father   ? ? ?ALLERGIES:  is allergic to vancomycin. ? ?MEDICATIONS:  ?Current Outpatient Medications  ?Medication Sig  Dispense Refill  ? finasteride (PROSCAR) 5 MG tablet Take 5 mg by mouth daily.    ? Multiple Vitamin (MULTIVITAMIN) capsule Take 1 capsule by mouth daily.    ? multivitamin-lutein (OCUVITE-LUTEIN) CAPS capsule Take 1 capsule by mouth daily.    ? ?No current facility-administered medications for this visit.  ? ? ?REVIEW OF SYSTEMS:   ?Constitutional: ( - ) fevers, ( - )  chills , ( - ) night sweats ?Eyes: ( - ) blurriness of vision, ( - ) double vision, ( - ) watery eyes ?Ears, nose, mouth, throat, and face: ( - ) mucositis, ( - ) sore throat ?Respiratory: ( +) cough, ( + ) dyspnea, ( - ) wheezes ?Cardiovascular: ( - ) palpitation, ( - ) chest discomfort, ( - ) lower extremity swelling ?Gastrointestinal:  ( - ) nausea, ( - ) heartburn, ( - ) change in bowel habits ?Skin: ( - ) abnormal skin rashes ?Lymphatics: ( - ) new lymphadenopathy, ( - ) easy bruising ?Neurological: ( - ) numbness, ( +) tingling, ( - ) new weaknesses ?Behavioral/Psych: ( - ) mood change, ( - ) new changes  ?All other systems were reviewed with the patient and are negative. ? ?PHYSICAL EXAMINATION: ?ECOG PERFORMANCE STATUS: 1 - Symptomatic but completely ambulatory ? ?Vitals:  ? 12/16/21 1242  ?BP: 137/70  ?Pulse: (!) 38  ?Resp: 20  ?Temp: (!) 97.3 ?F (36.3 ?C)  ?SpO2: 100%  ? ?Filed Weights  ? 12/16/21 1242  ?Weight: 215 lb 4.8 oz (97.7 kg)  ? ? ?GENERAL: well appearing male in NAD  ?SKIN: skin color, texture, turgor are normal, no rashes or significant lesions ?EYES: conjunctiva are pink and non-injected, sclera clear ?OROPHARYNX: no exudate, no erythema; lips, buccal mucosa, and tongue normal  ?NECK: supple, non-tender ?LYMPH:  no palpable lymphadenopathy in the cervical or supraclavicular lymph nodes.  ?LUNGS: clear to auscultation and percussion with normal breathing effort ?HEART: regular rate & rhythm and no murmurs and no lower extremity edema ?ABDOMEN: soft, non-tender, non-distended, normal bowel sounds ?Musculoskeletal: no cyanosis of  digits and no clubbing  ?PSYCH: alert & oriented x 3, fluent speech ?NEURO: no focal motor/sensory deficits ? ?LABORATORY DATA:  ?I have reviewed the data as listed ? ?  Latest Ref Rng & Units 12/16/2021  ?  1:25 PM 01/21/2009  ?  8:09 AM 01/18/2009  ?  9:01 AM  ?CBC  ?WBC 4.0 - 10.5 K/uL 5.5   6.6   28.0    ?Hemoglobin 13.0 - 17.0 g/dL 15.8   13.4   13.5    ?Hematocrit 39.0 - 52.0 % 47.4   38.5   39.4    ?Platelets 150 - 400 K/uL 202   192   248    ? ? ? ?  Latest Ref Rng & Units 12/16/2021  ?  1:25 PM 01/16/2009  ?  3:40 AM 12/25/2008  ? 12:00  PM  ?CMP  ?Glucose 70 - 99 mg/dL 97   90     ?BUN 8 - 23 mg/dL 18   16     ?Creatinine 0.61 - 1.24 mg/dL 0.95   0.91   0.80    ?Sodium 135 - 145 mmol/L 140   138     ?Potassium 3.5 - 5.1 mmol/L 4.5   3.8     ?[place] 98 - 111 mmol/L 106   106     ?CO2 22 - 32 mmol/L 27   27     ?Calcium 8.9 - 10.3 mg/dL 9.3   8.5     ?Total Protein 6.5 - 8.1 g/dL 7.3   6.2     ?Total Bilirubin 0.3 - 1.2 mg/dL 0.7   0.5     ?Alkaline Phos 38 - 126 U/L 85   109     ?AST 15 - 41 U/L 21   29     ?ALT 0 - 44 U/L 18   35     ? ? ? ?RADIOGRAPHIC STUDIES: ?I have personally reviewed the radiological images as listed and agreed with the findings in the report. ?No results found. ? ?ASSESSMENT & PLAN ?Martin [name] is a 66 y.o. male who presents to the clinic for evaluation of multisystem abnormalities including cardiac and GI. One possibility for his current symptoms include AL Amyloidosis. We recommend to undergo additional workup to further evaluate this.  ? ?Patient will proceed with labs today to check CBC, CMP, LDH, beta-2 microglobulin, serum protein electrophoresis with immunofixation, serum free light chains and urine protein electrophoresis.  ? ?We will await for upcoming cardiac MRI scheduled on 12/23/2021 to see if there is any amyloid deposition.  ? ?We will determine follow up once our workup is complete. Patient expressed understanding of the plan provided.  ? ?Orders Placed This Encounter   ?Procedures  ? CBC with Differential ([place] Only)  ?  Standing Status:   Future  ?  Number of Occurrences:   1  ?  Standing Expiration Date:   12/17/2022  ? CMP ([place] only)  ?  Standing Status

## 2021-12-16 NOTE — Telephone Encounter (Signed)
?  Pt said, he is trying to get into amyloid clinic and to do that he needs to get his cardiac MRI and the soonest he can schedule in on 04/25. He wanted to ask Dr. Caryl Comes if he can get him scheduled sooner for the MRI ?

## 2021-12-17 LAB — BETA 2 MICROGLOBULIN, SERUM: Beta-2 Microglobulin: 1.2 mg/L (ref 0.6–2.4)

## 2021-12-19 ENCOUNTER — Other Ambulatory Visit: Payer: Self-pay

## 2021-12-19 DIAGNOSIS — K529 Noninfective gastroenteritis and colitis, unspecified: Secondary | ICD-10-CM | POA: Diagnosis not present

## 2021-12-19 DIAGNOSIS — Q249 Congenital malformation of heart, unspecified: Secondary | ICD-10-CM

## 2021-12-19 DIAGNOSIS — I498 Other specified cardiac arrhythmias: Secondary | ICD-10-CM | POA: Diagnosis not present

## 2021-12-19 LAB — KAPPA/LAMBDA LIGHT CHAINS
Kappa free light chain: 16.4 mg/L (ref 3.3–19.4)
Kappa, lambda light chain ratio: 2.19 — ABNORMAL HIGH (ref 0.26–1.65)
Lambda free light chains: 7.5 mg/L (ref 5.7–26.3)

## 2021-12-20 LAB — MULTIPLE MYELOMA PANEL, SERUM
Albumin SerPl Elph-Mcnc: 3.7 g/dL (ref 2.9–4.4)
Albumin/Glob SerPl: 1.3 (ref 0.7–1.7)
Alpha 1: 0.2 g/dL (ref 0.0–0.4)
Alpha2 Glob SerPl Elph-Mcnc: 0.8 g/dL (ref 0.4–1.0)
B-Globulin SerPl Elph-Mcnc: 1.1 g/dL (ref 0.7–1.3)
Gamma Glob SerPl Elph-Mcnc: 1 g/dL (ref 0.4–1.8)
Globulin, Total: 3 g/dL (ref 2.2–3.9)
IgA: 76 mg/dL (ref 61–437)
IgG (Immunoglobin G), Serum: 838 mg/dL (ref 603–1613)
IgM (Immunoglobulin M), Srm: 76 mg/dL (ref 20–172)
Total Protein ELP: 6.7 g/dL (ref 6.0–8.5)

## 2021-12-22 ENCOUNTER — Telehealth (HOSPITAL_COMMUNITY): Payer: Self-pay | Admitting: *Deleted

## 2021-12-22 LAB — UPEP/UIFE/LIGHT CHAINS/TP, 24-HR UR
% BETA, Urine: 0 %
ALPHA 1 URINE: 0 %
Albumin, U: 100 %
Alpha 2, Urine: 0 %
Free Kappa Lt Chains,Ur: 9.68 mg/L (ref 1.17–86.46)
Free Kappa/Lambda Ratio: 6.63 (ref 1.83–14.26)
Free Lambda Lt Chains,Ur: 1.46 mg/L (ref 0.27–15.21)
GAMMA GLOBULIN URINE: 0 %
Total Protein, Urine-Ur/day: 97 mg/24 hr (ref 30–150)
Total Protein, Urine: 5.7 mg/dL
Total Volume: 1700

## 2021-12-22 NOTE — Telephone Encounter (Signed)
Reaching out to patient to offer assistance regarding upcoming cardiac imaging study; pt verbalizes understanding of appt date/time, parking situation and where to check in, and verified current allergies; name and call back number provided for further questions should they arise  Barbette Mcglaun RN Navigator Cardiac Imaging [facility] [phone] office [phone] cell  Patient reports having an MRI in the past without issue. 

## 2021-12-23 ENCOUNTER — Ambulatory Visit (HOSPITAL_COMMUNITY)
Admission: RE | Admit: 2021-12-23 | Discharge: 2021-12-23 | Disposition: A | Discharge status: Home / Self Care | Payer: PPO | Source: Ambulatory Visit | Attending: Internal Medicine | Admitting: Internal Medicine

## 2021-12-23 DIAGNOSIS — I498 Other specified cardiac arrhythmias: Secondary | ICD-10-CM | POA: Diagnosis not present

## 2021-12-23 MED ORDER — GADOBUTROL 1 MMOL/ML IV SOLN
10.0000 mL | Freq: Once | INTRAVENOUS | Status: AC | PRN
  Administered 2021-12-23: 10 mL via INTRAVENOUS

## 2021-12-26 ENCOUNTER — Encounter: Payer: Self-pay | Admitting: Physician Assistant

## 2021-12-26 ENCOUNTER — Telehealth: Payer: Self-pay | Admitting: Physician Assistant

## 2021-12-26 NOTE — Telephone Encounter (Signed)
I called. 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 Findings revealed no evidence of monoclonal protein to suggest AL amyloidosis. We do not recommend a bone marrow biopsy at this time. We suggest for patient to follow up with cardiology for further workup of his symptoms.  ?

## 2021-12-27 ENCOUNTER — Encounter: Payer: Self-pay | Admitting: Internal Medicine

## 2021-12-27 DIAGNOSIS — E854 Organ-limited amyloidosis: Secondary | ICD-10-CM

## 2021-12-27 DIAGNOSIS — I517 Cardiomegaly: Secondary | ICD-10-CM

## 2021-12-29 ENCOUNTER — Encounter: Payer: Self-pay | Admitting: Internal Medicine

## 2022-01-03 ENCOUNTER — Other Ambulatory Visit (HOSPITAL_COMMUNITY): Payer: PPO

## 2022-01-10 ENCOUNTER — Telehealth (HOSPITAL_COMMUNITY): Payer: Self-pay | Admitting: *Deleted

## 2022-01-10 NOTE — Telephone Encounter (Signed)
Close encounter 

## 2022-01-11 ENCOUNTER — Ambulatory Visit (HOSPITAL_COMMUNITY)
Admission: RE | Admit: 2022-01-11 | Discharge: 2022-01-11 | Disposition: A | Discharge status: Home / Self Care | Payer: PPO | Source: Ambulatory Visit | Attending: Cardiovascular Disease | Admitting: Cardiovascular Disease

## 2022-01-11 ENCOUNTER — Encounter: Payer: Self-pay | Admitting: Internal Medicine

## 2022-01-11 DIAGNOSIS — I517 Cardiomegaly: Secondary | ICD-10-CM | POA: Insufficient documentation

## 2022-01-11 DIAGNOSIS — E854 Organ-limited amyloidosis: Secondary | ICD-10-CM | POA: Insufficient documentation

## 2022-01-11 DIAGNOSIS — I43 Cardiomyopathy in diseases classified elsewhere: Secondary | ICD-10-CM | POA: Diagnosis not present

## 2022-01-11 MED ORDER — TECHNETIUM TC 99M PYROPHOSPHATE
21.3000 | Freq: Once | INTRAVENOUS | Status: AC
  Administered 2022-01-11: 21.3 via INTRAVENOUS

## 2022-01-13 ENCOUNTER — Telehealth: Payer: Self-pay | Admitting: Internal Medicine

## 2022-01-13 NOTE — Telephone Encounter (Signed)
Pt states that he is needing a copy of Cardiac MRI that was done on 12/23/21. Pt states that he needs this before an upcoming appt that he has on 01/20/22. Pt would also like a call to confirm that he is able to pick up copies or that they have been mailed. Please advise ?

## 2022-01-13 NOTE — Telephone Encounter (Signed)
Spoke with patient and made him aware that I am working with Clarise Cruz, our Cardiac Imaging Nurse Navigator and the Imaging Department to get him 2 CDs with his Cardiac MRI results on them to take to his appts with [facility] and [facility]. Made him aware that I have let them know that his appt is on 5/12 and that Clarise Cruz will call him when they are ready for pick up. He appreciated the call. ?

## 2022-01-17 ENCOUNTER — Other Ambulatory Visit (HOSPITAL_COMMUNITY): Payer: PPO

## 2022-01-18 ENCOUNTER — Telehealth: Payer: Self-pay | Admitting: Internal Medicine

## 2022-01-18 NOTE — Telephone Encounter (Signed)
Attempted phone call to [place] at the phone number provided.  Transferred x 3 and was unsuccessful at reaching [place]. ?Pt has received discs and results of PYP scan as requested to take with him to his appointment. ?

## 2022-01-18 NOTE — Telephone Encounter (Signed)
Calling to see if the results from the scan on 5/3 can be sent to there office. Fax # [phone] ?

## 2022-01-25 DIAGNOSIS — I493 Ventricular premature depolarization: Secondary | ICD-10-CM | POA: Diagnosis not present

## 2022-01-25 DIAGNOSIS — R4189 Other symptoms and signs involving cognitive functions and awareness: Secondary | ICD-10-CM | POA: Diagnosis not present

## 2022-01-25 DIAGNOSIS — K529 Noninfective gastroenteritis and colitis, unspecified: Secondary | ICD-10-CM | POA: Diagnosis not present

## 2022-01-25 DIAGNOSIS — R5383 Other fatigue: Secondary | ICD-10-CM | POA: Diagnosis not present

## 2022-05-01 DIAGNOSIS — I493 Ventricular premature depolarization: Secondary | ICD-10-CM | POA: Diagnosis not present

## 2022-05-01 DIAGNOSIS — R9431 Abnormal electrocardiogram [ECG] [EKG]: Secondary | ICD-10-CM | POA: Diagnosis not present

## 2022-05-01 DIAGNOSIS — R4189 Other symptoms and signs involving cognitive functions and awareness: Secondary | ICD-10-CM | POA: Diagnosis not present

## 2022-05-01 DIAGNOSIS — K529 Noninfective gastroenteritis and colitis, unspecified: Secondary | ICD-10-CM | POA: Diagnosis not present

## 2022-05-01 DIAGNOSIS — R4181 Age-related cognitive decline: Secondary | ICD-10-CM | POA: Diagnosis not present

## 2022-05-01 DIAGNOSIS — I454 Nonspecific intraventricular block: Secondary | ICD-10-CM | POA: Diagnosis not present

## 2022-05-01 DIAGNOSIS — E782 Mixed hyperlipidemia: Secondary | ICD-10-CM | POA: Diagnosis not present

## 2022-05-11 ENCOUNTER — Telehealth: Payer: Self-pay | Admitting: Internal Medicine

## 2022-05-11 NOTE — Telephone Encounter (Signed)
Patient called and mentioned that Dr. Gerarda Fraction is requesting tracing of his stress test and zio monitor. Please fax to [phone]

## 2022-05-19 ENCOUNTER — Encounter: Payer: Self-pay | Admitting: Internal Medicine

## 2022-05-22 ENCOUNTER — Encounter: Payer: Self-pay | Admitting: Neurology

## 2022-05-24 DIAGNOSIS — H0100B Unspecified blepharitis left eye, upper and lower eyelids: Secondary | ICD-10-CM | POA: Diagnosis not present

## 2022-05-24 DIAGNOSIS — H0100A Unspecified blepharitis right eye, upper and lower eyelids: Secondary | ICD-10-CM | POA: Diagnosis not present

## 2022-05-24 DIAGNOSIS — H5203 Hypermetropia, bilateral: Secondary | ICD-10-CM | POA: Diagnosis not present

## 2022-05-25 ENCOUNTER — Encounter: Payer: Self-pay | Admitting: Neurology

## 2022-05-25 ENCOUNTER — Ambulatory Visit: Payer: PPO | Admitting: Neurology

## 2022-05-25 VITALS — BP 121/82 | HR 59 | Ht 74.0 in | Wt 216.8 lb

## 2022-05-25 DIAGNOSIS — I480 Paroxysmal atrial fibrillation: Secondary | ICD-10-CM

## 2022-05-25 DIAGNOSIS — R4189 Other symptoms and signs involving cognitive functions and awareness: Secondary | ICD-10-CM | POA: Diagnosis not present

## 2022-05-25 NOTE — Progress Notes (Signed)
[facility]    Provider:  Dr Jaynee Eagles Requesting Provider: [name], MD Primary Care Provider:  [name], MD  CC:  cognitive changes  05/25/2022: Patient with borderline FDG PET Scan for dementia (reviewed, very unlikely), Negative PET Amyloid scan, extensive workup [facility] a patient of Dr. Grier Rocher who is head of the dementia clinic and Healthy brain Study.  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 He has "leaky gut syndrome" and has improved with diet (reviewed Dr. Olin Pia and electrophysiology notes from earlier this year) and he carries a diagnosis of "leaky gut syndrome".  Otherwise he has a past medical history of perceived cognitive deficits extensively evaluated as above, C. difficile, celiac artery dissection in 2015, cervical spondylosis, chronic diarrhea, cognitive dysfunction, migraines, mild cognitive impairment, negative sleep apnea, renal vein thrombosis, septic pulmonary embolism remote, vasomotor rhinitis.  Per cardiology, no evidence of thoracic malignancy, lymphadenopathy, suspicious pulmonary nodularity, normal liver and pancreas and biliary tree with likely clinically insignificant small thyroid nodules and a benign cyst of the left kidney and aortic atherosclerosis, ventricular bigeminy, questionable dumping syndrome.  He was also evaluated for amyloidosis.  He also reports in the last several years, as above and below, "brain fog", fatigue, and he has developed various cardiac abnormalities as above.  Patient went to [facility] as a second opinion for amyloidosis, they do not think he has it, the brain fog comes and goes,  today he doesn't notice it, he had a complete sleep study which was "normal" per patient, no hypoxemia, no sleep apnea, negative for amyloid, he had an episode of afib on cardiac monitor (I recommend loop but he is seeing dr Caryl Comes we discussed), also has bigeminy, still having brain fog on and off maybe associated with bigeminy or the afib unclear, he lost 15-20 pounds and wondering if gaining that back may have worsened and caused sleep apnea, I recommend an in-lab (not home test). His amyloid scan was normal, his fdg pet scan was incinclusive but he is getting better and the "brain fog" comes and goes, he feels normal this week, no hallucinations, no signs/symptoms of lewy body, no personaity changes or signs/symptoms of FTD, he has NOT progressed in a year so incredibly unlikely Alzheimer's or any other neurodegenerative disease. I would recommend a loop recorder.   01/2022: MRI amyloid scanning:   Heart to contralateral lung ratio is between 1-1.5, indeterminate for amyloid. By semi-quantitative assessment scan is consistent with increased heart uptake but less than rib uptake-Grade 1.   Study is equivocal for TTR amyloidosis (visual score of 1/ratio between 1-1.5).   Prior study not available for comparison.  12/23/2021: : IMPRESSION: 1. Normal left ventricular chamber size and function, LVEF 55%. Grossly normal left ventricular wall thickness, maximum thickness 10 mm in basal anteroseptum. Posterior wall is normal thickness. No evidence of hypertrophic cardiomyopathy.   2. Mild right ventricular chamber enlargement with normal right ventricular systolic function, RVEF 17%.   3. Delayed myocardial enhancement is seen in the anterior and inferior RV insertion points, which is a nonspecific finding but may suggest increased pulmonary pressure.   4. No myocardial edema. Normal T1 myocardial nulling kinetics suggest against amyloidosis. Normal range ECV 26%.  5. No findings to suggest  infiltrative or inflammatory cardiomyopathic process. No scar or infarct.   6. Ectopy during study may impact the diagnostic quality of volumetric measurements and calculation of ejection fraction.   Patient complains of symptoms per HPI as well as the following symptoms: brain fog . Pertinent negatives and positives per HPI. 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  Extensive evaluation has been completed to this date, including nuclear medicine PET metabolic FDG scan, MRI of the brain with and without contrast, amyloid PET scan, formal neurocognitive testing, serum testing for reversible causes of his cognitive symptoms.  This week he reports he is feeling well, but he describes his symptoms as brain fog, like waking up from anesthesia, the symptoms wax and wane but at some point he felt like work was getting too much, it took a lot of effort to see patients and he retired.  Patient reported he significantly misses his profession and his patients tell me that they miss him quite a bit as well.  He has had GI problems since 2010 after a significant bout of pyelonephritis with septic pulmonary emboli, renal vein thrombosis and extended inpatient hospitalization.  He has been extensively evaluated by gastroenterology and likely carcinoid was ruled out but we continue to wonder about the gut brain association and the effect this might be having on his cognition.  He has had a sleep test, mild obstructive sleep apnea was diagnosed, today we discussed considering an in lab study which would be more accurate.  He has had low B12 in the past but supplements and follows his levels.  He sees Dr. Elyn Aquas. Bateman at [facility] and he has a follow-up appointment to discuss possibly joining the healthy brain study.   Overall his neurocognitive testing with Dr. Rondel Oh was reassuring, there were no abnormalities on his neuropsychological assessment, abnormalities on FDG PET scan with fairly mild and nonspecific and not nearly enough evidence to suggest neurodegenerative disorder, amyloid PET scan was normal without evidence of amyloid plaques.  Dr. Rondel Oh did note that another possibility for his cognitive problems could be a systemic disorder that is not a primary neurologic problem but something that is causing the sensation of brain fog.  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  He has brain fog, like waking uo from anesthesia, everything is slower, work ws getting too much, took a lot of effort to see patients. He was ruled out for carcinoid. He has had a lot of GI problems since 2010 with pyelonephritis. He may have leaky gut, but it is controversial, he was a special diet, he is workingon his health, pre/probiotics,    Review of Systems: Patient complains of symptoms per HPI as well as the following symptoms brain fog. Pertinent negatives and positives per HPI. All others negative.   Social History   Socioeconomic History   Marital status: Single    Spouse name: Not on file   Number of children: Not on file   Years of education: Not on file   Highest education level: Professional school degree (e.g., MD, DDS, DVM, JD)  Occupational History    Comment: MD  Tobacco Use   Smoking status: Never    Passive exposure:  Never   Smokeless tobacco: Never  Vaping Use   Vaping Use: Never used  Substance and Sexual Activity   Alcohol use: No    Alcohol/week: 0.0 standard drinks of alcohol   Drug use: No   Sexual activity: Not on file  Other Topics Concern   Not on file  Social History Narrative   Not on file   Social Determinants of Health   Financial Resource Strain: Not on file  Food Insecurity: Not on file  Transportation Needs: Not on file   Physical Activity: Not on file  Stress: Not on file  Social Connections: Not on file  Intimate Partner Violence: Not on file    Family History  Problem Relation Age of Onset   Hypertension Mother    Heart attack Father    Dementia Neg Hx    Alzheimer's disease Neg Hx     Past Medical History:  Diagnosis Date   Adenomatous colon polyp    Allergic rhinitis    Benign neoplasm of colon    C. 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    GERD (gastroesophageal reflux disease)    Hx of colonic polyp    Hx of migraine headaches    Hypercholesterolemia    Migraine    Mild cognitive impairment    Mild sleep apnea    Ocular rosacea    Renal vein thrombosis (HCC)    Scoliosis    Septic pulmonary embolism ([place]) 2010   Vasomotor rhinitis    Ventricular bigeminy     Patient Active Problem List   Diagnosis Date Noted   Brain fog 05/25/2022   Rapid palpitations 12/06/2021   Paroxysmal atrial fibrillation ([place]) 10/05/2021   Ventricular bigeminy 08/01/2021   CLOSTRIDIUM DIFFICILE COLITIS 01/21/2009   CANDIDIASIS OF THE ESOPHAGUS 01/21/2009   NEUTROPENIA, DRUG-INDUCED 01/21/2009   PYELONEPHRITIS 01/21/2009   RENAL VEIN THROMBOSIS 01/12/2009   OTHER DISEASES OF LUNG NOT ELSEWHERE CLASSIFIED 01/12/2009   DYSPNEA 01/12/2009    Past Surgical History:  Procedure Laterality Date   ANTERIOR CRUCIATE LIGAMENT REPAIR Right 1990   ELBOW SURGERY Right    MOHS SURGERY  1990   nose, basal cell   SHOULDER ACROMIOPLASTY Left 12/2014   shoulder arthroscopy  1995    Current Outpatient Medications  Medication Sig Dispense Refill   finasteride (PROSCAR) 5 MG tablet Take 5 mg by mouth daily.     Multiple Vitamin (MULTIVITAMIN) capsule Take 1 capsule by mouth daily.     multivitamin-lutein (OCUVITE-LUTEIN) CAPS capsule Take 1  capsule by mouth daily.     rosuvastatin (CRESTOR) 20 MG tablet Take 20 mg by mouth daily.     No current facility-administered medications for this visit.    Allergies as of 05/25/2022 - Review Complete 05/25/2022  Allergen Reaction Noted   Vancomycin      Vitals: BP 121/82   Pulse (!) 59   Ht '6\' 2"'$  (1.88 m)   Wt 216 lb 12.8 oz (98.3 kg)   BMI 27.84 kg/m  Last Weight:  Wt Readings from Last 1 Encounters:  05/25/22 216 lb 12.8 oz (98.3 kg)   Last Height:   Ht Readings from Last 1 Encounters:  05/25/22 '6\' 2"'$  (1.88 m)   Exam: NAD, pleasant  Speech:    Speech is normal; fluent and spontaneous with normal comprehension.  Cognition:    The patient is oriented to person, place, and time;     recent and remote memory intact;     language fluent;    Cranial Nerves:    The pupils are equal, round, and reactive to light.Trigeminal sensation is intact and the muscles of mastication are normal. The face is symmetric. The palate elevates in the midline. Hearing intact. Voice is normal. Shoulder shrug is normal. The tongue has normal motion without fasciculations.   Coordination:  No dysmetria  Motor Observation:    No asymmetry, no atrophy, and no involuntary movements noted. Tone:    Normal muscle tone.     Strength:    Strength is V/V in the upper and lower limbs.      Sensation: intact to LT     Assessment/Plan:  66 y.o. male here as requested by [name], MD for follow-up.  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  Extensive evaluation has been completed to this date, including nuclear medicine PET metabolic FDG scan, MRI of the brain with and without contrast, amyloid PET scan, formal neurocognitive testing, serum testing for reversible causes of his cognitive symptoms. He describes his symptoms as brain fog, like waking up from anesthesia, the symptoms wax and wane but at some  point he felt like work was getting too much, it took a lot of effort to see patients and he retired.  Patient reported he significantly misses his profession and his patients tell me that they miss him quite a bit as well.  He has had GI problems since 2010 after a significant bout of pyelonephritis with septic pulmonary emboli, renal vein thrombosis and extended inpatient hospitalization.  He has been extensively evaluated by gastroenterology  but we continue to wonder about the gut brain association and the effect this might be having on his cognition.    - Overall his neurocognitive testing was reassuring, there were no abnormalities on his neuropsychological assessment, abnormalities on FDG PET scan with fairly mild and nonspecific and not nearly enough evidence to suggest neurodegenerative disorder, amyloid PET scan was normal without evidence of amyloid plaques.    - Recommendations today: Per Dr. Isidoro Donning, would consider in-lab sleep testing . Also consider a trial of stimulant for hypersomnia? - Per Dr. Rondel Oh, trial of donepezil despite unlikely neurodegenerative disease? Sometimes it still helps. - Recommend Loop recorder to monitor afib, emailed Dr. Erlinda Hong (stroke neuro) for opinion should prob at least be on asa but will check with vascular neurology - follow up with Dr. Rondel Oh in January - Follow up here as needed - Since not worsening, brain fog waxes and wanes, very unlikely neurodegenerative process but continue to monitor and can consider repeating formal memory testing - can consider asa '81mg'$ /'325mg'$  discuss with cardiology Dr. Caryl Comes and at [facility] and loop recorder to monitor afib  Prior Assessment and plan: - He has had a sleep test, negative obstructive sleep apnea was diagnosed, we discussed considering an in lab study which would be more accurate.   - He has had low B12 in the past but supplements and follows his levels.   - He sees Dr. Elyn Aquas. Rondel Oh at [facility] and  he has a follow-up appointment to discuss possibly joining the healthy brain study in January 2024 - Dr. Rondel Oh did note that another possibility for his  cognitive problems could be a systemic disorder that is not a primary neurologic problem but something that is causing the sensation of brain fog.   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   - Since he is feeling better, I encouraged him to continue with his current diet, focusing on prebiotic's and probiotics and general health and wellbeing.   Cc: [name], MD,  [name], MD  Sarina Ill, MD  [facility] [address] [place], [place] [address]  Phone [phone] Fax [phone]  I spent over 50 minutes of face-to-face and non-face-to-face time with patient on the  1. Brain fog   2. Paroxysmal atrial fibrillation (HCC)    diagnosis.  This included previsit chart review, lab review, study review, order entry, electronic health record documentation, patient education on the different diagnostic and therapeutic options, counseling and coordination of care, risks and benefits of management, compliance, or risk factor reduction

## 2022-05-25 NOTE — Patient Instructions (Addendum)
-   Recommendations: Per Dr. Isidoro Donning, would consider in-lab sleep testing . Also consider a trial of stimulant for hypersomnia? - Per Dr. Rondel Oh, trial of donepezil despite unlikely neurodegenerative disease? Sometimes it still helps. - Recommend Loop recorder to monitor afib - follow up with Dr. Rondel Oh in January - Follow up here as needed - Since not worsening, brain fog waxes and wanes, very unlikely neurodegenerative process but continue to monitor and can consider repeating formal memory testing - can consider asa '81mg'$ /'325mg'$  discuss with cardiology Dr. Caryl Comes and at [facility] and loop recorder to monitor afib

## 2022-05-29 DIAGNOSIS — M79674 Pain in right toe(s): Secondary | ICD-10-CM | POA: Diagnosis not present

## 2022-05-29 DIAGNOSIS — M89371 Hypertrophy of bone, right ankle and foot: Secondary | ICD-10-CM | POA: Diagnosis not present

## 2022-05-29 DIAGNOSIS — M89372 Hypertrophy of bone, left ankle and foot: Secondary | ICD-10-CM | POA: Diagnosis not present

## 2022-05-29 DIAGNOSIS — M79675 Pain in left toe(s): Secondary | ICD-10-CM | POA: Diagnosis not present

## 2022-05-29 DIAGNOSIS — M2041 Other hammer toe(s) (acquired), right foot: Secondary | ICD-10-CM | POA: Diagnosis not present

## 2022-05-31 NOTE — Telephone Encounter (Signed)
How do we do that for him?  [place] is easiest Thanks SK

## 2022-06-08 DIAGNOSIS — Z125 Encounter for screening for malignant neoplasm of prostate: Secondary | ICD-10-CM | POA: Diagnosis not present

## 2022-06-08 DIAGNOSIS — G3184 Mild cognitive impairment, so stated: Secondary | ICD-10-CM | POA: Diagnosis not present

## 2022-06-08 DIAGNOSIS — R5383 Other fatigue: Secondary | ICD-10-CM | POA: Diagnosis not present

## 2022-06-08 DIAGNOSIS — Z0000 Encounter for general adult medical examination without abnormal findings: Secondary | ICD-10-CM | POA: Diagnosis not present

## 2022-06-08 DIAGNOSIS — E7800 Pure hypercholesterolemia, unspecified: Secondary | ICD-10-CM | POA: Diagnosis not present

## 2022-06-08 DIAGNOSIS — Z23 Encounter for immunization: Secondary | ICD-10-CM | POA: Diagnosis not present

## 2022-06-08 DIAGNOSIS — K591 Functional diarrhea: Secondary | ICD-10-CM | POA: Diagnosis not present

## 2022-06-08 DIAGNOSIS — G43909 Migraine, unspecified, not intractable, without status migrainosus: Secondary | ICD-10-CM | POA: Diagnosis not present

## 2022-06-08 DIAGNOSIS — I493 Ventricular premature depolarization: Secondary | ICD-10-CM | POA: Diagnosis not present

## 2022-06-21 ENCOUNTER — Ambulatory Visit: Payer: PPO | Admitting: Neurology

## 2022-07-05 ENCOUNTER — Encounter: Payer: Self-pay | Admitting: Internal Medicine

## 2022-07-05 ENCOUNTER — Ambulatory Visit: Payer: PPO | Attending: Internal Medicine | Admitting: Internal Medicine

## 2022-07-05 VITALS — BP 136/72 | HR 62 | Ht 74.0 in | Wt 203.6 lb

## 2022-07-05 DIAGNOSIS — R002 Palpitations: Secondary | ICD-10-CM | POA: Diagnosis not present

## 2022-07-05 DIAGNOSIS — I480 Paroxysmal atrial fibrillation: Secondary | ICD-10-CM | POA: Diagnosis not present

## 2022-07-05 DIAGNOSIS — I498 Other specified cardiac arrhythmias: Secondary | ICD-10-CM | POA: Diagnosis not present

## 2022-07-05 NOTE — Patient Instructions (Signed)
Medication Instructions:  Your physician recommends that you continue on your current medications as directed. Please refer to the Current Medication list given to you today.  *If you need a refill on your cardiac medications before your next appointment, please call your pharmacy*   Lab Work: None ordered.  If you have labs (blood work) drawn today and your tests are completely normal, you will receive your results only by: [place] (if you have MyChart) OR A paper copy in the mail If you have any lab test that is abnormal or we need to change your treatment, we will call you to review the results.   Testing/Procedures: Your physician has requested that you have an echocardiogram. Echocardiography is a painless test that uses sound waves to create images of your heart. It provides your doctor with information about the size and shape of your heart and how well your heart's chambers and valves are working. This procedure takes approximately one hour. There are no restrictions for this procedure. Please do NOT wear cologne, perfume, aftershave, or lotions (deodorant is allowed). Please arrive 15 minutes prior to your appointment time.    Follow-Up: At [facility], you and your health needs are our priority.  As part of our continuing mission to provide you with exceptional heart care, we have created designated Provider Care Teams.  These Care Teams include your primary Cardiologist (physician) and Advanced Practice Providers (APPs -  Physician Assistants and Nurse Practitioners) who all work together to provide you with the care you need, when you need it.  We recommend signing up for the patient portal called "MyChart".  Sign up information is provided on this After Visit Summary.  MyChart is used to connect with patients for Virtual Visits (Telemedicine).  Patients are able to view lab/test results, encounter notes, upcoming appointments, etc.  Non-urgent messages can be  sent to your provider as well.   To learn more about what you can do with MyChart, go to [email or web address].    Your next appointment:   6 months with Dr Caryl Comes - Echo prior to that appointment   Important Information About Sugar

## 2022-07-05 NOTE — Progress Notes (Signed)
ELECTROPHYSIOLOGY CONSULT NOTE  Patient ID: Martin [name], MRN: [id number], DOB/AGE: [birth year] 66 y.o. Admit date: (Not on file) Date of Consult: 07/05/2022  Primary Physician: Martin [name], [name] Primary Cardiologist: new    HPI    Martin [name] is a 66 y.o. 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  Subsequently identified as having new atrial fibrillation also noted to have left ventricular hypertrophy the absence of hypertension.  He carries a diagnosis of "leaky gut syndrome "and has moved towards a FODMAP diet with significant improvement.  Has had significant cognitive decline.  He has subsequently stopped the diet because it did not really help.  His cognitive issues are little bit better.  He has been started on verapamil and uptitrated to 120 twice daily, he has been taking no benefit, and no benefit from a trial of beta-blockers.  Seen at [facility] clinic 8/23 with recommended initiation of flecainide and discussed ablation, the latter deferred secondary to the paucity of clear associated symptoms and retained LV function-- he did not initiate, as he noted that if his weight or lower, he had less symptoms of brain fog and PVCs.  He has lost 20 pounds and feels terrific  The patient denies chest pain, shortness of breath, nocturnal dyspnea, orthopnea or peripheral edema.  There have been no palpitations, lightheadedness or syncope.       DATE TEST EF   10/22 Echo  55-60 % LVH 5m   2/23 CaScore  0  4/23 cMRI 55% NO LVH LGE anterior  5/23 PYP  Equivocal    Date Cr K Hgb  9/22 0.77 4.5 14.9   4/23 0.95 4.5 15.8   Date PVCs  2/23 17%               Thromboembolic risk factors ( age -69) for a CHADSVASc Score of >=1    Past Medical History:  Diagnosis Date   Adenomatous colon polyp    Allergic rhinitis    Benign neoplasm of colon    C. 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    GERD (gastroesophageal reflux disease)    Hx of colonic polyp    Hx of migraine headaches    Hypercholesterolemia    Migraine    Mild cognitive impairment    Mild sleep apnea    Ocular rosacea    Renal vein thrombosis (HCC)    Scoliosis    [place]   Vasomotor rhinitis    Ventricular bigeminy       Surgical History:  Past Surgical History:  Procedure Laterality Date   ANTERIOR CRUCIATE LIGAMENT REPAIR Right 1990   ELBOW SURGERY Right    MOHS SURGERY  1990   nose, basal cell   SHOULDER ACROMIOPLASTY Left 12/2014   shoulder arthroscopy  1995     Home Meds: Current Meds  Medication Sig   finasteride (PROSCAR) 5 MG tablet Take 5 mg by mouth daily.   Multiple Vitamin (MULTIVITAMIN) capsule Take 1 capsule by mouth daily.   multivitamin-lutein (OCUVITE-LUTEIN) CAPS capsule Take 1 capsule by mouth daily.   Rosuvastatin Calcium 10 MG CPSP Take 20 mg by mouth daily.    Allergies:  Allergies  Allergen Reactions   Vancomycin     REACTION: neutropenia          ROS: Please see the history of present illness.   All other systems are reviewed and negative.    Physical examination  BP 136/72   Pulse 62   Ht '6\' 2"'$  (1.88 m)   Wt 203 lb 9.6 oz (92.4 kg)   SpO2 99%   BMI 26.14 kg/m   Well developed and nourished in no acute distress HENT normal Neck supple with JVP-  flat   Clear Regular rate and rhythm, no murmurs or gallops Abd-soft with active BS No Clubbing cyanosis edema Skin-warm and dry A & Oriented  Grossly normal sensory and motor function  ECG sinus @ 62 16/10/42     09/06/21 ECG: Sinus 59 Interval 16/10/44 Axis 56 PVCs right bundle superior axis   Assessment and Plan:  Ventricular bigeminy right bundle superior axis  Tachy  palpitations  Left ventricular hypertrophy  Brain fog question question  cause  Question dumping syndrome  cMRI with late gadolinium enhancement at the anterior and inferior RV insertion site suggesting possible increased pulmonary pressure (TR not sufficient to measure on echo 10/22)   No interval atrial fibrillation   PVCs are queiscent with the weight loss.  Lengthy discussion regarding the monitoring for atrial fibrillation.  Based on Assert 2 would not recommend anticoagulation with less than 12-24 hours of atrial fibrillation in the absence of prior stroke.  We have discussed technologies to monitor for prolonged atrial fibrillation, we have previously reviewed the role of the [employer]ive[employer] monitor and being able to do that daily, he would like something that is more constant, and I think that wearable technology either with a cardia band or the Apple watch suffice for both sensitivity and specificity for episodes of atrial fibrillation that would be sufficiently long to justify anticoagulation in a nonstroke patient.  We will plan a repeat echo in about 6 months.

## 2022-07-26 DIAGNOSIS — R110 Nausea: Secondary | ICD-10-CM | POA: Diagnosis not present

## 2022-07-26 DIAGNOSIS — R1013 Epigastric pain: Secondary | ICD-10-CM | POA: Diagnosis not present

## 2022-07-26 DIAGNOSIS — K529 Noninfective gastroenteritis and colitis, unspecified: Secondary | ICD-10-CM | POA: Diagnosis not present

## 2022-07-26 DIAGNOSIS — R197 Diarrhea, unspecified: Secondary | ICD-10-CM | POA: Diagnosis not present

## 2022-07-26 DIAGNOSIS — I493 Ventricular premature depolarization: Secondary | ICD-10-CM | POA: Diagnosis not present

## 2022-12-21 ENCOUNTER — Ambulatory Visit (HOSPITAL_COMMUNITY): Payer: PPO | Attending: Internal Medicine

## 2022-12-21 DIAGNOSIS — I480 Paroxysmal atrial fibrillation: Secondary | ICD-10-CM

## 2022-12-21 DIAGNOSIS — I498 Other specified cardiac arrhythmias: Secondary | ICD-10-CM | POA: Diagnosis not present

## 2022-12-21 DIAGNOSIS — R002 Palpitations: Secondary | ICD-10-CM

## 2022-12-21 LAB — ECHOCARDIOGRAM COMPLETE
Area-P 1/2: 3.5 cm2
S' Lateral: 3.8 cm

## 2024-06-17 ENCOUNTER — Encounter: Payer: Self-pay | Admitting: Neurology

## 2024-06-19 ENCOUNTER — Telehealth: Payer: Self-pay | Admitting: Neurology

## 2024-06-19 NOTE — Telephone Encounter (Signed)
 Pt called in today and he would like to discuss some issues prior to his appointment. Thanks

## 2024-06-20 NOTE — Telephone Encounter (Signed)
 Patient is scheduled for Monday to see Dr. Tobie, thanked me for calling.

## 2024-06-23 ENCOUNTER — Other Ambulatory Visit

## 2024-06-23 ENCOUNTER — Ambulatory Visit: Admitting: Neurology

## 2024-06-23 VITALS — BP 132/79 | HR 62 | Ht 72.0 in | Wt 214.0 lb

## 2024-06-23 DIAGNOSIS — R29898 Other symptoms and signs involving the musculoskeletal system: Secondary | ICD-10-CM | POA: Diagnosis not present

## 2024-06-23 DIAGNOSIS — R202 Paresthesia of skin: Secondary | ICD-10-CM | POA: Diagnosis not present

## 2024-06-23 DIAGNOSIS — R200 Anesthesia of skin: Secondary | ICD-10-CM

## 2024-06-23 NOTE — Patient Instructions (Signed)
Nerve testing of the right arm and leg.    Check labs  ELECTROMYOGRAM AND NERVE CONDUCTION STUDIES (EMG/NCS) INSTRUCTIONS  How to Prepare The neurologist conducting the EMG will need to know if you have certain medical conditions. Tell the neurologist and other EMG lab personnel if you: . Have a pacemaker or any other electrical medical device . Take blood-thinning medications . Have hemophilia, a blood-clotting disorder that causes prolonged bleeding Bathing Take a shower or bath shortly before your exam in order to remove oils from your skin. Don't apply lotions or creams before the exam.  What to Expect You'll likely be asked to change into a hospital gown for the procedure and lie down on an examination table. The following explanations can help you understand what will happen during the exam.  . Electrodes. The neurologist or a technician places surface electrodes at various locations on your skin depending on where you're experiencing symptoms. Or the neurologist may insert needle electrodes at different sites depending on your symptoms.  . Sensations. The electrodes will at times transmit a tiny electrical current that you may feel as a twinge or spasm. The needle electrode may cause discomfort or pain that usually ends shortly after the needle is removed. If you are concerned about discomfort or pain, you may want to talk to the neurologist about taking a short break during the exam.  . Instructions. During the needle EMG, the neurologist will assess whether there is any spontaneous electrical activity when the muscle is at rest - activity that isn't present in healthy muscle tissue - and the degree of activity when you slightly contract the muscle.  He or she will give you instructions on resting and contracting a muscle at appropriate times. Depending on what muscles and nerves the neurologist is examining, he or she may ask you to change positions during the exam.  After your EMG You  may experience some temporary, minor bruising where the needle electrode was inserted into your muscle. This bruising should fade within several days. If it persists, contact your primary care doctor.    

## 2024-06-23 NOTE — Progress Notes (Signed)
 [facility] Neurology Division Clinic Note - Initial Visit   Date: 06/23/2024   [name] MD MRN: [id number] DOB: 1956/06/18   Dear Dr. 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 Although his history is well known to you, please allow us  to reiterate it for the purpose of our medical record. The patient was accompanied to the clinic by self.    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 male with hyperlipidemia presenting for evaluation of numbness/tingling of the hands and feet.   IMPRESSION/PLAN: Bilateral hand and feet paresthesias.  Prior NCS/EMG was reviewed which showed diffuse neurogenic changes on needle exam, however sensory and motor NCS was normal.  I would like to repeat NCS of the right arm and add the right leg to further evaluate his symptoms.  His exam shows distal sensory loss in the the first two toes, and otherwise is normal.  With intact reflexes, CIDP is less likely.  No findings on exam to suggest ALS which would not manifest with sensory complaints.  I explained that entrapment neuropathy such as CTS is possible and he may have L5 radiculopathy vs deep peroneal neuropathy vs Morton's neuroma causing sensory change in the toes.  Further testing to include:  - Check vitamin B12, folate, copper, SPEP with IFE  - NCS/EMG of the right arm and leg  - MRI lumbar spine without contrast  Further recommendations pending results.   ------------------------------------------------------------- History of present illness: Starting early 2024, began having numbness in the 1st and 2nd toes.  He saw neurosurgery and was advised to monitor symptoms.  There has been no significant change in the numbness of the toes over the past year.  Over the past month, he has been having fatigue in the legs and reports that they feel tired.  He denies weakness or imbalance.   In spring 2025, began having numbness in the thumb and  index finger of both hands.  Symptoms are constant.  He gets fatigued more easily and has cramps in the fingers.  He is dropping things.   He saw Dr. Joshua for these symptoms and had MRI cervical spine which showed mild cervical spondylosis and moderate left C5 foraminal stenosis.  NCS/EMG performed at [facility] showed multilevel radiculopathies affecting C5-C8 with active denervation at C7 bilaterally.  Given that there is no compressive pathology on his imaging to correspond to his EMG findings, he is referred to neurology for further evaluation.   Out-side paper records, electronic medical record, and images have been reviewed where available and summarized as:  NCS/EMG of the arms 05/26/2024:  Evidence of multilevel cervical radiculopathy affecting bilateral C5-C8 nerve root, evidence of active denervation potentially affecting bilateral C7 nerve roots.   MRI cervical spine 06/05/2024: Multilevel cervical spondylosis without significant spinal stenosis or overt neural impingement.  Multifactorial degenerative changes with resultant moderate left C5 foraminal stenosis, with mild left C7 foraminal narrowing. Moderate left sided facet hypertrophy at C2-3 with associated mild reactive marrow edema.  Findings could contribute to neck pain.    Past Medical History:  Diagnosis Date   Adenomatous colon polyp    Allergic rhinitis    Benign neoplasm of colon    C. 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    GERD (  gastroesophageal reflux disease)    Hx of colonic polyp    Hx of migraine headaches    Hypercholesterolemia    Migraine    Mild cognitive impairment    Mild sleep apnea    Ocular rosacea    Renal vein thrombosis (HCC)    Scoliosis    Septic pulmonary embolism (HCC) 2010   Vasomotor rhinitis    Ventricular bigeminy      Past Surgical History:  Procedure Laterality Date   ANTERIOR CRUCIATE LIGAMENT REPAIR Right 1990   ELBOW SURGERY Right    MOHS SURGERY  1990   nose, basal cell   SHOULDER ACROMIOPLASTY Left 12/2014   shoulder arthroscopy  1995     Medications:  Outpatient Encounter Medications as of 06/23/2024  Medication Sig   finasteride (PROSCAR) 5 MG tablet Take 5 mg by mouth daily.   Multiple Vitamin (MULTIVITAMIN) capsule Take 1 capsule by mouth daily.   multivitamin-lutein (OCUVITE-LUTEIN) CAPS capsule Take 1 capsule by mouth daily.   Rosuvastatin Calcium 10 MG CPSP Take 20 mg by mouth daily.   No facility-administered encounter medications on file as of 06/23/2024.    Allergies:  Allergies  Allergen Reactions   Vancomycin      REACTION: neutropenia    Family History: Family History  Problem Relation Age of Onset   Hypertension Mother    Heart attack Father    Dementia Neg Hx    Alzheimer's disease Neg Hx     Social History: Social History   Tobacco Use   Smoking status: Never    Passive exposure: Never   Smokeless tobacco: Never  Vaping Use   Vaping status: Never Used  Substance Use Topics   Alcohol use: No    Alcohol/week: 0.0 standard drinks of alcohol   Drug use: No   Social History   Social History Narrative   Not on file    Vital Signs:  BP 132/79   Pulse 62   Ht 6' (1.829 m)   Wt 214 lb (97.1 kg)   SpO2 96%   BMI 29.02 kg/m   Neurological Exam: MENTAL STATUS including orientation to time, place, person, recent and remote memory, attention span and concentration, language, and fund of knowledge is normal.  Speech is not dysarthric.  CRANIAL NERVES: II:  No visual field defects.     III-IV-VI: Pupils equal round and reactive to light.  Normal conjugate, extra-ocular eye movements in all directions of gaze.  No nystagmus.  No ptosis.   V:  Normal facial sensation.    VII:  Normal facial symmetry and movements.   VIII:  Normal hearing and  vestibular function.   IX-X:  Normal palatal movement.   XI:  Normal shoulder shrug and head rotation.   XII:  Normal tongue strength and range of motion, no deviation or fasciculation.  MOTOR:  No atrophy, fasciculations or abnormal movements.  No pronator drift.   Upper Extremity:  Right  Left  Deltoid  5/5   5/5   Biceps  5/5   5/5   Triceps  5/5   5/5   Wrist extensors  5/5   5/5   Wrist flexors  5/5   5/5   Finger extensors  5/5   5/5   Finger flexors  5/5   5/5   Dorsal interossei  5/5   5/5   Abductor pollicis  5/5   5/5   Tone (Ashworth scale)  0  0   Lower Extremity:  Right  Left  Hip flexors  5/5   5/5   Knee flexors  5/5   5/5   Knee extensors  5/5   5/5   Dorsiflexors  5/5   5/5   Plantarflexors  5/5   5/5   Toe extensors  5/5   5/5   Toe flexors  5/5   5/5   Tone (Ashworth scale)  0  0   MSRs:                                           Right        Left brachioradialis 2+  2+  biceps 2+  2+  triceps 2+  2+  patellar 2+  2+  ankle jerk 2+  2+  Hoffman no  no  plantar response down  down   SENSORY:  Vibration absent at the great toe, and intact above the ankles.  Temperature and pin prick reduced over the 1st and 2nd toe bilaterally, intact in the 3rd-5th toes and dorsum of the feet.    COORDINATION/GAIT: Normal finger-to- nose-finger.  Intact rapid alternating movements bilaterally.  Gait narrow based and stable. Tandem and stressed gait intact.     Thank you for allowing me to participate in patient's care.  If I can answer any additional questions, I would be pleased to do so.    Sincerely,    Fuad Forget K. Tobie, DO

## 2024-06-28 ENCOUNTER — Encounter: Payer: Self-pay | Admitting: Neurology

## 2024-07-01 ENCOUNTER — Encounter: Payer: Self-pay | Admitting: Neurology

## 2024-07-01 LAB — B12 AND FOLATE PANEL
Folate: >24 ng/mL
Vitamin B-12: 689 pg/mL (ref 200–1100)

## 2024-07-01 LAB — IMMUNOFIXATION ELECTROPHORESIS
IgM, Serum: 61 mg/dL (ref 50–300)
IgM, Serum: 815 mg/dL (ref 600–300)
Immunoglobulin A: 60 mg/dL — AB (ref 70–320)
Immunoglobulin A: 815 mg/dL — AB (ref 70–320)

## 2024-07-01 LAB — COPPER, SERUM: Copper: 100 ug/dL (ref 70–175)

## 2024-07-01 LAB — PROTEIN ELECTROPHORESIS, SERUM
Albumin ELP: 4.2 g/dL (ref 3.8–4.8)
Alpha 1: 0.3 g/dL (ref 0.2–0.3)
Alpha 2: 0.7 g/dL (ref 0.5–0.9)
Beta 2: 0.2 g/dL (ref 0.2–0.5)
Beta Globulin: 0.5 g/dL (ref 0.4–0.6)
Gamma Globulin: 0.7 g/dL — ABNORMAL LOW (ref 0.8–1.7)
Total Protein: 6.5 g/dL (ref 6.1–8.1)

## 2024-07-02 ENCOUNTER — Other Ambulatory Visit

## 2024-07-03 ENCOUNTER — Inpatient Hospital Stay: Attending: Hematology and Oncology | Admitting: Hematology and Oncology

## 2024-07-03 VITALS — BP 122/72 | HR 61 | Temp 98.0°F | Resp 14 | Wt 216.3 lb

## 2024-07-03 DIAGNOSIS — R7689 Other specified abnormal immunological findings in serum: Secondary | ICD-10-CM

## 2024-07-03 DIAGNOSIS — G629 Polyneuropathy, unspecified: Secondary | ICD-10-CM | POA: Insufficient documentation

## 2024-07-03 DIAGNOSIS — D802 Selective deficiency of immunoglobulin A [IgA]: Secondary | ICD-10-CM | POA: Insufficient documentation

## 2024-07-03 NOTE — Progress Notes (Signed)
 [facility] [phone]   [phone]  PROGRESS NOTE  Patient Care Team: Charlott Dorn LABOR, MD as PCP - General (Internal Medicine) Signa Rush, MD (Inactive) as Referring Physician (Internal Medicine)  Hematological/Oncological History # Low IgA  # Polyneuropathy   Interval History:  Martin LITTIE Ly MD 68 y.o. male with medical history significant for low IgA and a polyneuropathy who presents for a follow up visit. The patient's last visit was on 12/16/2021 at which time we are ruling out AL amyloidosis.. In the interim since the last visit he has continued to struggle with his neuropathy and presents today after having repeat immunoglobulin and SPEP drawn.  On exam today Dr. Ly reports that he continues to have numbness in his big toes and considerable weakness.  He also has weakness in his fingers.  He is working with a neurologist and recently had a nerve conduction study performed.  He reports that he suspects he might have CIDP.  He is relieved to learn that his SPEP did not show any concerning abnormalities and his IgA was only mildly low.  He reports will be having a nerve conduction study on 07/11/2024.  He otherwise has been at his baseline level of health with no fevers, chills, sweats, nausea, vomiting or diarrhea.  Full 10 point ROS otherwise negative.  MEDICAL HISTORY:  Past Medical History:  Diagnosis Date   Adenomatous colon polyp    Allergic rhinitis    Benign neoplasm of colon    C. 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    GERD (gastroesophageal reflux disease)    Hx of colonic polyp    Hx of migraine headaches    Hypercholesterolemia    Migraine    Mild cognitive impairment    Mild sleep apnea    Ocular rosacea    Renal vein thrombosis (HCC)    Scoliosis     Septic pulmonary embolism (HCC) 2010   Vasomotor rhinitis    Ventricular bigeminy     SURGICAL HISTORY: Past Surgical History:  Procedure Laterality Date   ANTERIOR CRUCIATE LIGAMENT REPAIR Right 1990   ELBOW SURGERY Right    MOHS SURGERY  1990   nose, basal cell   SHOULDER ACROMIOPLASTY Left 12/2014   shoulder arthroscopy  1995    SOCIAL HISTORY: Social History   Socioeconomic History   Marital status: Single    Spouse name: Not on file   Number of children: Not on file   Years of education: Not on file   Highest education level: Professional school degree (e.g., MD, DDS, DVM, JD)  Occupational History    Comment: MD  Tobacco Use   Smoking status: Never    Passive exposure: Never   Smokeless tobacco: Never  Vaping Use   Vaping status: Never Used  Substance and Sexual Activity   Alcohol use: No    Alcohol/week: 0.0 standard drinks of alcohol   Drug use: No   Sexual activity: Not on file  Other Topics Concern   Not on file  Social History Narrative   Not on file   Social Drivers of Health   Financial Resource Strain: Not on file  Food Insecurity: Not on file  Transportation Needs: Not on file  Physical Activity: Not on file  Stress: Not on file  Social Connections:  Not on file  Intimate Partner Violence: Not on file    FAMILY HISTORY: Family History  Problem Relation Age of Onset   Hypertension Mother    Heart attack Father    Dementia Neg Hx    Alzheimer's disease Neg Hx     ALLERGIES:  is allergic to vancomycin .  MEDICATIONS:  Current Outpatient Medications  Medication Sig Dispense Refill   finasteride (PROSCAR) 5 MG tablet Take 5 mg by mouth daily.     Multiple Vitamin (MULTIVITAMIN) capsule Take 1 capsule by mouth daily.     multivitamin-lutein (OCUVITE-LUTEIN) CAPS capsule Take 1 capsule by mouth daily.     Rosuvastatin Calcium 10 MG CPSP Take 20 mg by mouth daily.     No current facility-administered medications for this visit.     REVIEW OF SYSTEMS:   Constitutional: ( - ) fevers, ( - )  chills , ( - ) night sweats Eyes: ( - ) blurriness of vision, ( - ) double vision, ( - ) watery eyes Ears, nose, mouth, throat, and face: ( - ) mucositis, ( - ) sore throat Respiratory: ( - ) cough, ( - ) dyspnea, ( - ) wheezes Cardiovascular: ( - ) palpitation, ( - ) chest discomfort, ( - ) lower extremity swelling Gastrointestinal:  ( - ) nausea, ( - ) heartburn, ( - ) change in bowel habits Skin: ( - ) abnormal skin rashes Lymphatics: ( - ) new lymphadenopathy, ( - ) easy bruising Neurological: ( - ) numbness, ( - ) tingling, ( - ) new weaknesses Behavioral/Psych: ( - ) mood change, ( - ) new changes  All other systems were reviewed with the patient and are negative.  PHYSICAL EXAMINATION:  Vitals:   07/03/24 1518  BP: 122/72  Pulse: 61  Resp: 14  Temp: 98 F (36.7 C)  SpO2: 100%   Filed Weights   07/03/24 1518  Weight: 216 lb 4.8 oz (98.1 kg)    GENERAL: Well-appearing middle-age Caucasian male, alert, no distress and comfortable SKIN: skin color, texture, turgor are normal, no rashes or significant lesions EYES: conjunctiva are pink and non-injected, sclera clear LUNGS: clear to auscultation and percussion with normal breathing effort HEART: regular rate & rhythm and no murmurs and no lower extremity edema Musculoskeletal: no cyanosis of digits and no clubbing  PSYCH: alert & oriented x 3, fluent speech NEURO: no focal motor/sensory deficits  LABORATORY DATA:  I have reviewed the data as listed    Latest Ref Rng & Units 12/16/2021    1:25 PM 01/21/2009    8:09 AM 01/18/2009    9:01 AM  CBC  WBC 4.0 - 10.5 K/uL 5.5  6.6  28.0   Hemoglobin 13.0 - 17.0 g/dL 84.1  86.5  86.4   Hematocrit 39.0 - 52.0 % 47.4  38.5  39.4   Platelets 150 - 400 K/uL 202  192  248        Latest Ref Rng & Units 06/23/2024   10:10 AM 12/16/2021    1:25 PM 01/16/2009    3:40 AM  CMP  Glucose 70 - 99 mg/dL  97  90   BUN 8 - 23  mg/dL  18  16   Creatinine 9.38 - 1.24 mg/dL  9.04  9.08   Sodium 864 - 145 mmol/L  140  138   Potassium 3.5 - 5.1 mmol/L  4.5  3.8   Chloride 98 - 111 mmol/L  106  106   CO2 22 -  32 mmol/L  27  27   Calcium 8.9 - 10.3 mg/dL  9.3  8.5   Total Protein 6.1 - 8.1 g/dL 6.5  7.3  6.2   Total Bilirubin 0.3 - 1.2 mg/dL  0.7  0.5   Alkaline Phos 38 - 126 U/L  85  109   AST 15 - 41 U/L  21  29   ALT 0 - 44 U/L  18  35     Lab Results  Component Value Date   MPROTEIN Not Observed 12/16/2021   Lab Results  Component Value Date   KPAFRELGTCHN 16.4 12/16/2021   LAMBDASER 7.5 12/16/2021   KAPLAMBRATIO 6.63 12/19/2021   KAPLAMBRATIO 2.19 (H) 12/16/2021    RADIOGRAPHIC STUDIES: No results found.  ASSESSMENT & PLAN Martin LITTIE Ly MD 68 y.o. male with medical history significant for low IgA and a polyneuropathy who presents for a follow up visit.  # Abnromal SPEP # Low IgA -- Discussed with Dr. Ly that there was no evidence of monoclonal protein.  Very low suspicion that this represents a neuropathy due to a monoclonal gammopathy -- IgA level is only modestly low, would recommend repeat testing. -- Patient expresses concern this may be CIDP, but defer workup to neurology.  Additionally we could refer to a neurologist in our practice if you would like. -- Return to clinic on an as needed basis.  No orders of the defined types were placed in this encounter.   All questions were answered. The patient knows to call the clinic with any problems, questions or concerns.  A total of more than 25 minutes were spent on this encounter with face-to-face time and non-face-to-face time, including preparing to see the patient, ordering tests and/or medications, counseling the patient and coordination of care as outlined above.   Norleen IVAR Kidney, MD Department of Hematology/Oncology [facility] Phone: [phone] Pager: [phone] Email:  [email or web address]  07/06/2024 6:52 PM

## 2024-07-04 ENCOUNTER — Encounter: Payer: Self-pay | Admitting: Physical Medicine & Rehabilitation

## 2024-07-04 ENCOUNTER — Encounter: Payer: Self-pay | Admitting: Neurology

## 2024-07-04 ENCOUNTER — Ambulatory Visit
Admission: RE | Admit: 2024-07-04 | Discharge: 2024-07-04 | Disposition: A | Discharge status: Home / Self Care | Source: Ambulatory Visit | Attending: Neurology | Admitting: Neurology

## 2024-07-04 DIAGNOSIS — R200 Anesthesia of skin: Secondary | ICD-10-CM

## 2024-07-04 DIAGNOSIS — R29898 Other symptoms and signs involving the musculoskeletal system: Secondary | ICD-10-CM

## 2024-07-05 ENCOUNTER — Encounter: Payer: Self-pay | Admitting: Neurology

## 2024-07-07 ENCOUNTER — Emergency Department (HOSPITAL_COMMUNITY)

## 2024-07-07 ENCOUNTER — Encounter (HOSPITAL_COMMUNITY): Payer: Self-pay

## 2024-07-07 ENCOUNTER — Emergency Department (HOSPITAL_COMMUNITY)
Admission: EM | Admit: 2024-07-07 | Discharge: 2024-07-07 | Disposition: A | Discharge status: Home / Self Care | Attending: Emergency Medicine | Admitting: Emergency Medicine

## 2024-07-07 ENCOUNTER — Other Ambulatory Visit: Payer: Self-pay

## 2024-07-07 DIAGNOSIS — M4722 Other spondylosis with radiculopathy, cervical region: Secondary | ICD-10-CM | POA: Diagnosis not present

## 2024-07-07 DIAGNOSIS — R202 Paresthesia of skin: Secondary | ICD-10-CM | POA: Diagnosis not present

## 2024-07-07 DIAGNOSIS — R200 Anesthesia of skin: Secondary | ICD-10-CM | POA: Diagnosis present

## 2024-07-07 DIAGNOSIS — R5383 Other fatigue: Secondary | ICD-10-CM | POA: Insufficient documentation

## 2024-07-07 DIAGNOSIS — M4802 Spinal stenosis, cervical region: Secondary | ICD-10-CM | POA: Insufficient documentation

## 2024-07-07 DIAGNOSIS — R531 Weakness: Secondary | ICD-10-CM | POA: Insufficient documentation

## 2024-07-07 DIAGNOSIS — E785 Hyperlipidemia, unspecified: Secondary | ICD-10-CM | POA: Insufficient documentation

## 2024-07-07 LAB — MENINGITIS/ENCEPHALITIS PANEL (CSF)

## 2024-07-07 LAB — CBC WITH DIFFERENTIAL/PLATELET
Abs Immature Granulocytes: 0.02 K/uL (ref 0.00–0.07)
Basophils Absolute: 0 K/uL (ref 0.0–0.1)
Basophils Relative: 0 %
Eosinophils Absolute: 0 K/uL (ref 0.0–0.5)
Eosinophils Relative: 0 %
HCT: 46.6 % (ref 39.0–52.0)
Hemoglobin: 15.9 g/dL (ref 13.0–17.0)
Immature Granulocytes: 0 %
Lymphocytes Relative: 6 %
Lymphs Abs: 0.4 K/uL — ABNORMAL LOW (ref 0.7–4.0)
MCH: 32.1 pg (ref 26.0–34.0)
MCHC: 34.1 g/dL (ref 30.0–36.0)
MCV: 94 fL (ref 80.0–100.0)
Monocytes Absolute: 0.1 K/uL (ref 0.1–1.0)
Monocytes Relative: 1 %
Neutro Abs: 6.2 K/uL (ref 1.7–7.7)
Neutrophils Relative %: 93 %
Platelets: 184 K/uL (ref 150–400)
RBC: 4.96 MIL/uL (ref 4.22–5.81)
RDW: 12.5 % (ref 11.5–15.5)
WBC: 6.7 K/uL (ref 4.0–10.5)
nRBC: 0 % (ref 0.0–0.2)

## 2024-07-07 LAB — COMPREHENSIVE METABOLIC PANEL WITH GFR
ALT: 28 U/L (ref 0–44)
AST: 32 U/L (ref 15–41)
Albumin: 4.4 g/dL (ref 3.5–5.0)
Alkaline Phosphatase: 75 U/L (ref 38–126)
Anion gap: 11 (ref 5–15)
BUN: 11 mg/dL (ref 8–23)
CO2: 25 mmol/L (ref 22–32)
Calcium: 9.9 mg/dL (ref 8.9–10.3)
Chloride: 103 mmol/L (ref 98–111)
Creatinine, Ser: 0.74 mg/dL (ref 0.61–1.24)
GFR, Estimated: >60 mL/min (ref >60)
Glucose, Bld: 145 mg/dL — ABNORMAL HIGH (ref 70–99)
Potassium: 4.4 mmol/L (ref 3.5–5.1)
Sodium: 138 mmol/L (ref 135–145)
Total Bilirubin: 0.6 mg/dL (ref 0.0–1.2)
Total Protein: 6.9 g/dL (ref 6.5–8.1)

## 2024-07-07 LAB — CRYPTOCOCCAL ANTIGEN, CSF: Crypto Ag: NEGATIVE

## 2024-07-07 LAB — CSF CELL COUNT WITH DIFFERENTIAL
RBC Count, CSF: 0 /mm3
RBC Count, CSF: 11 /mm3 — ABNORMAL HIGH
Tube #: 1
Tube #: 4
WBC, CSF: 0 /mm3 (ref 0–5)
WBC, CSF: 1 /mm3 (ref 0–5)

## 2024-07-07 LAB — GLUCOSE, CSF: Glucose, CSF: 76 mg/dL — ABNORMAL HIGH (ref 40–70)

## 2024-07-07 LAB — PROTEIN, CSF: Total  Protein, CSF: 49 mg/dL — ABNORMAL HIGH (ref 15–45)

## 2024-07-07 MED ORDER — IMMUNE GLOBULIN (HUMAN) 10 GM/100ML IV SOLN
400.0000 mg/kg | Freq: Once | INTRAVENOUS | Status: DC
  Filled 2024-07-07: qty 300

## 2024-07-07 NOTE — Discharge Instructions (Signed)
 Go to the appointment tomorrow as planned

## 2024-07-07 NOTE — Plan of Care (Signed)
 On-call neurology note  Called by Darryle Law, EDP-patient of Dr. Tobie being evaluated for paresthesias with low suspicion for CIDP.  Lumbar spine imaging with [facility]. Obtained a spinal tap-protein borderline high at 49. Plan to give 1 dose of IVIG today and follow-up with Dr. Tobie tomorrow for further infusion planning and continuing outpatient neuromuscular follow-up. Appreciate Dr. Patsey, Dr. Laurice and Dr. Anthony participation in his care. Inpatient neurology is available as needed.  Eligio Lav, MD Neurology

## 2024-07-07 NOTE — ED Notes (Signed)
 Came back from lumbar puncture, unable to sit up for 4 hours. Has to lay flat. 1619 he can sit up

## 2024-07-07 NOTE — ED Provider Notes (Signed)
 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  {Add pertinent medical, surgical, social history, OB history to HPI:4750} 68 year old with prior medical history as detailed below presents for evaluation.  Patient is in the process of workup with outpatient neurology for extremity numbness and tingling.  Reports new numbness and fasciculations in his anterior thighs.  Recent workup includes MRI lumbar spine performed on Friday of last week.  Patient does not yet have results of the study.  Patient is concerned that he may have CIDP.  He is requesting IVIG and prednisone and expedited treatment.  Patient has not yet had an LP.  See recent clinic note (10/13) from Dr. 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 male with hyperlipidemia presenting for evaluation of numbness/tingling of the hands and feet.    IMPRESSION/PLAN: Bilateral hand and feet paresthesias.  Prior NCS/EMG was reviewed which showed diffuse neurogenic changes on needle exam, however sensory and motor NCS was normal.  I would like to repeat NCS of the right arm and add the right leg to further evaluate his symptoms.  His exam shows distal sensory loss in the the first two toes, and otherwise is normal.  With intact reflexes, CIDP is less likely.  No findings on exam to suggest ALS which would not manifest with sensory complaints.  I explained that entrapment neuropathy such as CTS is possible and he may have L5 radiculopathy vs deep peroneal neuropathy vs Morton's neuroma causing sensory change in the toes.  Further testing to include:                - Check vitamin B12, folate, copper, SPEP with IFE                - NCS/EMG of the right arm and leg                - MRI lumbar spine without contrast   Further recommendations pending  results.     ------------------------------------------------------------- History of present illness: Starting early 2024, began having numbness in the 1st and 2nd toes.  He saw neurosurgery and was advised to monitor symptoms.  There has been no significant change in the numbness of the toes over the past year.  Over the past month, he has been having fatigue in the legs and reports that they feel tired.  He denies weakness or imbalance.    In spring 2025, began having numbness in the thumb and index finger of both hands.  Symptoms are constant.  He gets fatigued more easily and has cramps in the fingers.  He is dropping things.    He saw Dr. Joshua for these symptoms and had MRI cervical spine which showed mild cervical spondylosis and moderate left C5 foraminal stenosis.  NCS/EMG performed at [facility] showed multilevel radiculopathies affecting C5-C8 with active denervation at C7 bilaterally.  Given that there is no compressive pathology on his imaging to correspond to his EMG findings, he is referred to neurology for further evaluation.     Out-side paper records, electronic medical record, and images have been reviewed where available and summarized as:  NCS/EMG of the arms 05/26/2024:  Evidence of multilevel cervical radiculopathy affecting bilateral C5-C8 nerve root, evidence of active denervation potentially affecting bilateral C7 nerve  roots.    MRI cervical spine 06/05/2024: 1. Multilevel cervical spondylosis without significant spinal stenosis or overt neural impingement.  2. Multifactorial degenerative changes with resultant moderate left C5 foraminal stenosis, with mild left C7 foraminal narrowing. 3. Moderate left sided facet hypertrophy at C2-3 with associated mild reactive marrow edema.  Findings could contribute to neck pain.    The history is provided by the patient and medical records.       Prior to Admission medications   Medication Sig Start Date End Date  Taking? Authorizing Provider  finasteride (PROSCAR) 5 MG tablet Take 5 mg by mouth daily.    [provider]  Multiple Vitamin (MULTIVITAMIN) capsule Take 1 capsule by mouth daily.    [provider]  multivitamin-lutein (OCUVITE-LUTEIN) CAPS capsule Take 1 capsule by mouth daily.    [provider]  Rosuvastatin Calcium 10 MG CPSP Take 20 mg by mouth daily.    [provider]    Allergies: Vancomycin     Review of Systems  Musculoskeletal:  Positive for extremity weakness.  All other systems reviewed and are negative.   Updated Vital Signs BP (!) 156/86   Pulse 81   Temp (!) 97.5 F (36.4 C)   Resp 18   Ht 6' 1 (1.854 m)   Wt 98 kg   SpO2 100%   BMI 28.50 kg/m   Physical Exam Vitals and nursing note reviewed.  Constitutional:      General: He is not in acute distress.    Appearance: He is well-developed.  HENT:     Head: Normocephalic and atraumatic.  Eyes:     Conjunctiva/sclera: Conjunctivae normal.  Cardiovascular:     Rate and Rhythm: Normal rate and regular rhythm.     Heart sounds: No murmur heard. Pulmonary:     Effort: Pulmonary effort is normal. No respiratory distress.     Breath sounds: Normal breath sounds.  Abdominal:     Palpations: Abdomen is soft.     Tenderness: There is no abdominal tenderness.  Musculoskeletal:        General: No swelling.     Cervical back: Neck supple.  Skin:    General: Skin is warm and dry.     Capillary Refill: Capillary refill takes less than 2 seconds.  Neurological:     Mental Status: He is alert and oriented to person, place, and time.     Motor: No weakness.  Psychiatric:        Mood and Affect: Mood normal.     (all labs ordered are listed, but only abnormal results are displayed) Labs Reviewed  COMPREHENSIVE METABOLIC PANEL WITH GFR  CBC WITH DIFFERENTIAL/PLATELET    EKG: None  Radiology: No results found.  {Document cardiac monitor, telemetry assessment procedure  when appropriate:32947} Procedures   Medications Ordered in the ED - No data to display    {Click here for ABCD2, HEART and other calculators REFRESH Note before signing:1}                              Medical Decision Making Patient presents with complaint of possible CIDP.  Patient is requesting expedited workup.  Patient known to [facility].  Patient's case briefly discussed with Dr. Voncile, Neurology.   Will perform LP for CSF studies.  Patient declined blind attempt.  He prefers fluoroscopy guidance.  CSF results pending.  Amount and/or Complexity of Data Reviewed Labs: ordered. Radiology: ordered.   ***  {  Document critical care time when appropriate  Document review of labs and clinical decision tools ie CHADS2VASC2, etc  Document your independent review of radiology images and any outside records  Document your discussion with family members, caretakers and with consultants  Document social determinants of health affecting pt's care  Document your decision making why or why not admission, treatments were needed:32947:::1}   Final diagnoses:  None    ED Discharge Orders     None

## 2024-07-07 NOTE — ED Triage Notes (Signed)
 Pt from home via POV.  Pt reports leg weakness starting approximately 1 week ago.   Pt reports bilateral numbness in fingers 1 and 2, numbness in both great toes.  Pt has extensive Hx of neuro workups, last seen by Dr. Tobie approximately 3 weeks ago.  Pt states he is concerned about CIDP.    Pt states his goal for todays visit is to begin CIDP treatment, or to see a neurologist regarding this issue.

## 2024-07-07 NOTE — ED Provider Notes (Addendum)
  Physical Exam  BP 125/84 (BP Location: Left Arm)   Pulse (!) 56   Temp 98 F (36.7 C) (Oral)   Resp 18   Ht 6' 1 (1.854 m)   Wt 98 kg   SpO2 99%   BMI 28.50 kg/m   Physical Exam  Procedures  Procedures  ED Course / MDM    Medical Decision Making Amount and/or Complexity of Data Reviewed Labs: ordered. Radiology: ordered.   Received in signout.  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  Came in for further workup.  Discussed with both Dr. Tobie and Deedra from neurology.  Will get dose of IVIG and follow-up tomorrow at 9 in the morning in the office.       Patsey Lot, MD 07/07/24 1611  Patient discussed with Dr. Tobie.  He found out that likely the IVIG would not be covered by insurance.  Now wants to go home to follow-up with the office tomorrow.  Will discharge.    Patsey Lot, MD 07/07/24 1734

## 2024-07-08 ENCOUNTER — Ambulatory Visit: Admitting: Neurology

## 2024-07-08 ENCOUNTER — Ambulatory Visit (INDEPENDENT_AMBULATORY_CARE_PROVIDER_SITE_OTHER): Admitting: Neurology

## 2024-07-08 VITALS — BP 136/76 | HR 62 | Ht 73.0 in | Wt 215.0 lb

## 2024-07-08 DIAGNOSIS — R292 Abnormal reflex: Secondary | ICD-10-CM | POA: Diagnosis not present

## 2024-07-08 DIAGNOSIS — R202 Paresthesia of skin: Secondary | ICD-10-CM

## 2024-07-08 NOTE — Progress Notes (Signed)
 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  The patient was accompanied to the clinic by self.  IMPRESSION/PLAN: Bilateral hand and feet paresthesias and new weakness of the legs.  His exam today shows brisk patella reflexes bilaterally and intact distal reflexes.  Thus far, there is no structural pathology on MRI cervical or lumbar imaging to explain his symptoms.  I recommend checking MRI thoracic spine wwo contrast given brisk reflexes on exam.    My suspicion for CIDP is low given his borderline elevation in CSF protein and intact reflexes. Therefore, I do not recommend treating empirically with IVIG or high dose steroids.  He has requested additional testing for CIDP, so ganglioside antibodies have been ordered.  He is scheduled to have NCS/EMG later this week, which will help guide the next steps.  He has requested a second opinion at [facility] through his PCP and we will try to expedite this.  --------------------------------------------- History of present illness: Starting early 2024, began having numbness in the 1st and 2nd toes.  He saw neurosurgery and was advised to monitor symptoms.  There has been no significant change in the numbness of the toes over the past year.  Over the past month, he has been having fatigue in the legs and reports that they feel tired.  He denies weakness or imbalance.    In spring 2025, began having numbness in the thumb and index finger of both hands.  Symptoms are constant.  He gets fatigued more easily and has cramps in the fingers.  He is dropping things.    He saw Dr. Joshua for these symptoms and had MRI cervical spine which showed mild cervical spondylosis and moderate left C5 foraminal stenosis.  NCS/EMG performed at [facility] showed multilevel radiculopathies affecting C5-C8 with active denervation at C7 bilaterally.  Given that there is no compressive pathology on his imaging to correspond to his EMG findings, he is referred to neurology for further evaluation.   UPDATE 07/08/2024:  Since he was last seen in the office on 10/13, he reports having new pain and cramping in the inner thighs and ongoing weakness in the legs.  Numbness of the feet and hands is unchanged. He went to the ER yesterday for expedite CSF testing due to concern of CIDP which shows normal cell count and mildly elevated CSF protein 49.  IVIG was offered, however due to concern that insurance may not cover the cost, he opted not to proceed.  MRI lumbar spine shows mild degenerative changes, no compressive nerve impingement.    He had extra prednisone which was prescribed to his dog and reports taking prednisone 40mg  x 2 days and 100mg  last night to see if this would help.  He is worried about losing leg strength and requesting direction admission for IVIG.  He would like to start empiric therapy for CIDP.   Medications:  Current Outpatient Medications on File Prior to Visit  Medication Sig Dispense Refill   finasteride (PROSCAR) 5 MG tablet Take 5 mg by mouth daily.     Multiple Vitamin (MULTIVITAMIN) capsule Take 1 capsule by mouth daily.     multivitamin-lutein (OCUVITE-LUTEIN) CAPS capsule Take 1 capsule by mouth daily.     Rosuvastatin Calcium 10 MG CPSP Take  20 mg by mouth daily.     No current facility-administered medications on file prior to visit.    Allergies:  Allergies  Allergen Reactions   Vancomycin      REACTION: neutropenia    Vital Signs:  BP 136/76   Pulse 62   Ht 6' 1 (1.854 m)   Wt 215 lb (97.5 kg)   SpO2 99%   BMI 28.37 kg/m   Neurological Exam: MENTAL STATUS including orientation to time, place, person, recent and remote memory, attention span and concentration, language, and fund of knowledge is  normal.  Speech is not dysarthric.  CRANIAL NERVES:   Pupils equal round and reactive to light.  Normal conjugate, extra-ocular eye movements in all directions of gaze.  No ptosis.  Face is symmetric. Palate elevates symmetrically.  Tongue is midline.  No fasciculations.   MOTOR:  No atrophy, fasciculations or abnormal movements.  No pronator drift.   Upper Extremity:  Right  Left  Deltoid  5/5   5/5   Biceps  5/5   5/5   Triceps  5/5   5/5   Finger extensors  5/5   5/5   Finger flexors  5/5   5/5   Dorsal interossei  5/5   5/5   Abductor pollicis  5/5   5/5   Tone (Ashworth scale)  0  0   Lower Extremity:  Right  Left  Hip flexors  5/5   5-/5   Hip extensors  5/5   5/5   Knee flexors  5/5   5/5   Knee extensors  5/5   5/5   Dorsiflexors  5/5   5/5   Plantarflexors  5/5   5/5   Toe extensors  5/5   5/5   Toe flexors  5/5   5/5   Tone (Ashworth scale)  0  0     MSRs:                                           Right        Left brachioradialis 2+  2+  biceps 2+  2+  triceps 2+  2+  patellar 3+  3+  ankle jerk 2+  2+  plantar response down  down   SENSORY:  Intact to vibration throughout except mildly diminished at the left great toe.  Temperature is intact throughout.    COORDINATION/GAIT:  Normal finger-to- nose-finger.  Intact rapid alternating movements bilaterally.  He is able to stand up from chair without using arms to push off.  Gait narrow based and stable.  Stressed and tandem gait intact.   Data: NCS/EMG of the arms 05/26/2024:  Evidence of multilevel cervical radiculopathy affecting bilateral C5-C8 nerve root, evidence of active denervation potentially affecting bilateral C7 nerve roots.    MRI cervical spine 06/05/2024: Multilevel cervical spondylosis without significant spinal stenosis or overt neural impingement.  Multifactorial degenerative changes with resultant moderate left C5 foraminal stenosis, with mild left C7 foraminal narrowing. Moderate left sided  facet hypertrophy at C2-3 with associated mild reactive marrow edema.  Findings could contribute to neck pain.    MRI lumbar spine wo 07/04/2024: 1. Acute Schmorl's node involving the superior endplate of L3 posterolaterally on the right with moderate edema. 2. Mild, diffuse disc bulging at L2-3 and L3-4 resulting in mild bilateral lateral recess stenosis at L2-3 and mild central  spinal canal stenosis and mild bilateral lateral recess stenosis at L3-4. No apparent nerve root impingement at either level.   CSF 07/07/2024:  R11 W0 P49* G76    Labs 06/23/2024:  vitamin B12 689, folate >24, copper 100, SPEP with IFE no M protein   Thank you for allowing me to participate in patient's care.  If I can answer any additional questions, I would be pleased to do so.    Sincerely,    Virgina Deakins K. Tobie, DO

## 2024-07-08 NOTE — Patient Instructions (Signed)
 MRI thoracic spine wwo contrast  Check labs  We will try to expedite your referral to [facility]

## 2024-07-09 ENCOUNTER — Telehealth (HOSPITAL_COMMUNITY): Payer: Self-pay | Admitting: Emergency Medicine

## 2024-07-09 LAB — VDRL, CSF: VDRL Quant, CSF: NONREACTIVE

## 2024-07-09 NOTE — Telephone Encounter (Signed)
 Patient called the ED back requesting that oligoclonal bands be added onto his CSF if they were not previously checked during his visit on Monday.  Patient was seen in the ED 2 days ago for concern for CIDP.  Does not appear that oligoclonal bands were checked during this visit.  Will order this test, however it does appear that this is a send out to [facility], therefore informed patient that ability to perform this test will be largely dependent on available fluid and if they have enough to send out for this test.  Patient expressed understanding.  I encouraged patient follow-up with his PCP and neurologist.

## 2024-07-09 NOTE — ED Notes (Signed)
 09/08/24 0730 Pt called requesting to see if specific test were done with the CSF. Phone call was deferred to Dr Rogelia.

## 2024-07-10 ENCOUNTER — Encounter: Payer: Self-pay | Admitting: Neurology

## 2024-07-10 ENCOUNTER — Other Ambulatory Visit: Payer: Self-pay

## 2024-07-10 DIAGNOSIS — R200 Anesthesia of skin: Secondary | ICD-10-CM

## 2024-07-10 DIAGNOSIS — R202 Paresthesia of skin: Secondary | ICD-10-CM

## 2024-07-10 DIAGNOSIS — R292 Abnormal reflex: Secondary | ICD-10-CM

## 2024-07-10 DIAGNOSIS — R29898 Other symptoms and signs involving the musculoskeletal system: Secondary | ICD-10-CM

## 2024-07-10 LAB — CSF CULTURE W GRAM STAIN
Culture: NO GROWTH
Gram Stain: NONE SEEN

## 2024-07-10 LAB — HSV CULTURE AND TYPING

## 2024-07-11 ENCOUNTER — Telehealth: Payer: Self-pay

## 2024-07-11 ENCOUNTER — Ambulatory Visit: Payer: Self-pay | Admitting: Neurology

## 2024-07-11 ENCOUNTER — Ambulatory Visit: Admitting: Neurology

## 2024-07-11 DIAGNOSIS — R200 Anesthesia of skin: Secondary | ICD-10-CM | POA: Diagnosis not present

## 2024-07-11 DIAGNOSIS — R202 Paresthesia of skin: Secondary | ICD-10-CM | POA: Diagnosis not present

## 2024-07-11 NOTE — Telephone Encounter (Signed)
 [facility] [phone] and was informed that they are unable to add CSF oligoclonal bands because they need CSF fluid and only have serum gel. Rep did check to see if she could add test with serum gel only and was able to do so.

## 2024-07-11 NOTE — Telephone Encounter (Signed)
 Noted

## 2024-07-11 NOTE — Procedures (Signed)
 [facility]  [address] 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  NCV & EMG Findings: Extensive electrodiagnostic testing of the left upper and lower extremity shows:  Left median, ulnar, radial, mixed palmar, sural, and superficial peroneal sensory responses are within normal limits. Left median, ulnar, and tibial motor responses are within normal limits.  Left peroneal motor response at the extensor digitorum brevis is reduced, and normal at the tibialis anterior, which is normal for patient's age. Left tibial H reflex study is within normal limits.  Left tibial F and ulnar F-wave studies are within normal limits. There is no evidence of active or chronic motor axonal loss changes affecting any of the tested muscles, including cervical and lumbar paraspinal muscles.  Motor unit configuration and recruitment pattern is within normal limits.  Impression: This is a normal study of the left upper and lower extremities.  In particular, there is no evidence of a large fiber sensorimotor polyneuropathy, cervical/lumbosacral radiculopathy, or carpal tunnel syndrome.   ___________________________ Tonita Blanch, DO    Nerve Conduction Studies   Stim Site NR Peak (ms) Norm Peak (ms) O-P Amp (V) Norm O-P Amp  Left Median Anti Sensory (2nd Digit)  32 C  Wrist    3.1 <3.8 23.2 >10  Left Radial Anti Sensory (Base 1st Digit)  32 C  Wrist    2.3 <2.8 19.5 >10  Left Sup Peroneal Anti Sensory (Ant Lat Mall)  32 C  12 cm    2.9 <4.6 9.4 >3  Left Sural Anti Sensory (Lat Mall)  32 C  Calf    3.2 <4.6 10.4 >3  Left Ulnar Anti Sensory (5th Digit)  32 C  Wrist    3.1 <3.2 13.9  >5     Stim Site NR Onset (ms) Norm Onset (ms) O-P Amp (mV) Norm O-P Amp Site1 Site2 Delta-0 (ms) Dist (cm) Vel (m/s) Norm Vel (m/s)  Left Median Motor (Abd Poll Brev)  32 C  Wrist    2.9 <4.0 9.2 >5 Elbow Wrist 6.1 33.0 54 >50  Elbow    9.0  8.7         Left Peroneal Motor (Ext Dig Brev)  32 C  Ankle    4.6 <6.0 *0.7 >2.5 B Fib Ankle 9.2 38.0 41 >40  B Fib    13.8  0.6  Poplt B Fib 2.2 10.0 45 >40  Poplt    16.0  0.5         Left Peroneal TA Motor (Tib Ant)  32 C  Fib Head    2.8 <4.5 5.3 >3 Poplit Fib Head 2.0 10.0 50 >40  Poplit    4.8 <5.7 5.2         Left Tibial Motor (Abd Hall Brev)  32 C  Ankle    5.7 <6.0 6.2 >4 Knee Ankle 10.6 44.0 42 >40  Knee    16.3  4.0         Left Ulnar Motor (Abd Dig Minimi)  32 C  Wrist    2.7 <3.1 12.6 >7 B Elbow Wrist 4.2 24.0 57 >50  B Elbow    6.9  12.1  A Elbow B  Elbow 2.0 10.0 50 >50  A Elbow    8.9  11.4            Stim Site NR Peak (ms) Norm Peak (ms) P-T Amp (V) Site1 Site2 Delta-P (ms) Norm Delta (ms)  Left Median/Ulnar Palm Comparison (Wrist - 8cm)  32 C  Median Palm    1.8 <2.2 65.8 Median Palm Ulnar Palm 0.0   Ulnar Palm    1.8 <2.2 14.9       Electromyography   Side Muscle Ins.Act Fibs Fasc Recrt Amp Dur Poly Activation Comment  Left 1stDorInt Nml Nml Nml Nml Nml Nml Nml Nml N/A  Left Abd Poll Brev Nml Nml Nml Nml Nml Nml Nml Nml N/A  Left PronatorTeres Nml Nml Nml Nml Nml Nml Nml Nml N/A  Left Biceps Nml Nml Nml Nml Nml Nml Nml Nml N/A  Left Triceps Nml Nml Nml Nml Nml Nml Nml Nml N/A  Left Deltoid Nml Nml Nml Nml Nml Nml Nml Nml N/A  Left AntTibialis Nml Nml Nml Nml Nml Nml Nml Nml N/A  Left Gastroc Nml Nml Nml Nml Nml Nml Nml Nml N/A  Left Flex Dig Long Nml Nml Nml Nml Nml Nml Nml Nml N/A  Left BicepsFemS Nml Nml Nml Nml Nml Nml Nml Nml N/A  Left RectFemoris Nml Nml Nml Nml Nml Nml Nml Nml N/A  Left GluteusMed Nml Nml Nml Nml Nml Nml Nml Nml N/A  Left Cervical Parasp Low Nml Nml Nml Nml *- *- *- *N.E. N/A  Left  Lumbo Parasp Low Nml Nml Nml Nml *- *- *- *N.E. N/A  Left Infraspinatus Nml Nml Nml Nml Nml Nml Nml Nml N/A      Waveforms:

## 2024-07-12 NOTE — ED Provider Notes (Signed)
 Transfer of Care Note I assumed care of Martin [name] on 07/13/2024 at 10:44 PM.  Briefly, Martin [name] is a 68 y.o. male with: Medical History[1] Surgical History[2]   Presenting for welcome to the team.  In brief, this is a 69 year old male with extensive history of bilateral upper and lower extremity distal paresthesias and weakness.  He has been followed outpatient by neurology.  He had a single episode of urinary incontinence today, which prompted coming to the emergency department.  On initial exam, rectal tone was okay, no abnormal postvoid residual, no new deficits on neurologic exam.  Neurology has been consulted  Plan at the time of handoff: Follow-up neurology recommendations  ED Course as of 07/13/24 0100  Sat Jul 12, 2024  2242 Neuro consulted, pending reccs [  ]  [AG]  2308 S. 1 year BUE and BLE toe, finger weakness/numbness. Extensive outpatient workup (including MRI l-spine). Neurology concerned for central cause. New urinary incontinence x 1. Rectal tone okay, normal post-void, no new deficits. Neurology following. Planning for MRI brain, t-spine.  [KM]    ED Course User Index [AG] Lavanda Blondie Bolster, DO [KM] Rockey Victory Faster, MD    Please refer to the original provider's note for additional information regarding the care of Martin [name].  Reassessment: I personally reassessed the patient:  Vital Signs:  The most current vitals were  Vitals:   07/13/24 0020  BP: 140/88  Pulse: 77  Resp: 19  Temp: 98.5 F (36.9 C)  SpO2: 95%   Hemodynamically stable. Mental Status:  The patient is alert  Additional MDM/ED Course: 68 year old male with past medical history HPI as documented above and in previous providers notes.  Neurology evaluated the patient at bedside.  Based on exam, they are not concerned about central process.  From their perspective, no immediate indication for MRI imaging.  I personally reassessed the patient and had an  extensive conversation with him.  He was describing numbness in his bilateral great toes as well as bilateral 1st and 2nd fingers.  He reports following with neurology on an outpatient where he has had MRI of the C and L-spine without any acute pathology.  He recently underwent lumbar puncture earlier this week that showed an elevation in protein, but no other frank abnormalities.  He has also undergone nerve conduction studies.  On my repeat examination, patient has intact cranial nerve function as well as 5/5 strength in all muscle groups of the upper and lower extremities.  Light touch sensation intact with the exception of the above digits.  Normal coordination.  1+ patellar reflexes.  No clonus at the ankles.  Given patient's reassuring exam, normal postvoid residual, normal rectal tone, I agree with neurology that there is no indication for immediate MRI.  Very low suspicion for cauda equina, conus medullaris, or epidural abscess/hematoma.   Remainder of ED workup reviewed.  White blood cell count normal at 4.6.  Hemoglobin stable at 15.6.  No severe electrolyte derangements or renal dysfunction on metabolic panel.  Inflammatory markers reassuring and normal.  Patient's urinalysis with negative nitrites, negative leukocyte esterase.  Not consistent with infection.  Patient safe for discharge home with close outpatient primary care and neurology follow-up.  Referral has been placed with that he can establish with our neurology team here.  Patient is understanding and agreeable to this plan.  Return precautions provided.    1. Paresthesias      ED Disposition     None  Patient seen in conjunction with Dr. Lorenso  Electronically signed by:  Rockey Victory CHRISTELLA Rolene, MD 07/12/2024 10:44 PM        [1] Past Medical History: Diagnosis Date  . Arthritis   . Enlarged prostate   . Headache   [2] Past Surgical History: Procedure Laterality Date  . ANTERIOR CRUCIATE LIGAMENT REPAIR  Right    Procedure: ANTERIOR CRUCIATE LIGAMENT REPAIR  . ELBOW SURGERY Right    Procedure: ELBOW SURGERY; 2002

## 2024-07-12 NOTE — ED Triage Notes (Addendum)
 Pt coming from home. Pt following with neurology in [place] foir spine tumor vs brain tumor vs MS. Pt having numbness down bilateral legs and weakness. Pt reports tonight weakness increased and urinary incont. instance. VSS

## 2024-07-13 ENCOUNTER — Emergency Department (HOSPITAL_BASED_OUTPATIENT_CLINIC_OR_DEPARTMENT_OTHER)
Admission: EM | Admit: 2024-07-13 | Discharge: 2024-07-13 | Disposition: A | Discharge status: Home / Self Care | Attending: Emergency Medicine | Admitting: Emergency Medicine

## 2024-07-13 ENCOUNTER — Emergency Department (HOSPITAL_COMMUNITY)

## 2024-07-13 ENCOUNTER — Other Ambulatory Visit: Payer: Self-pay

## 2024-07-13 ENCOUNTER — Encounter (HOSPITAL_BASED_OUTPATIENT_CLINIC_OR_DEPARTMENT_OTHER): Payer: Self-pay

## 2024-07-13 ENCOUNTER — Emergency Department (HOSPITAL_COMMUNITY)
Admission: EM | Admit: 2024-07-13 | Discharge: 2024-07-13 | Disposition: A | Discharge status: Home / Self Care | Source: Home / Self Care | Attending: Emergency Medicine | Admitting: Emergency Medicine

## 2024-07-13 DIAGNOSIS — R531 Weakness: Secondary | ICD-10-CM | POA: Diagnosis not present

## 2024-07-13 DIAGNOSIS — R200 Anesthesia of skin: Secondary | ICD-10-CM | POA: Insufficient documentation

## 2024-07-13 DIAGNOSIS — R292 Abnormal reflex: Secondary | ICD-10-CM | POA: Diagnosis present

## 2024-07-13 DIAGNOSIS — R202 Paresthesia of skin: Secondary | ICD-10-CM | POA: Insufficient documentation

## 2024-07-13 DIAGNOSIS — R32 Unspecified urinary incontinence: Secondary | ICD-10-CM | POA: Insufficient documentation

## 2024-07-13 MED ORDER — LORAZEPAM 1 MG PO TABS
0.5000 mg | ORAL_TABLET | Freq: Once | ORAL | Status: AC
  Administered 2024-07-13: 0.5 mg via ORAL
  Filled 2024-07-13: qty 1

## 2024-07-13 MED ORDER — GADOBUTROL 1 MMOL/ML IV SOLN
9.5000 mL | Freq: Once | INTRAVENOUS | Status: AC | PRN
  Administered 2024-07-13: 9.5 mL via INTRAVENOUS

## 2024-07-13 NOTE — ED Provider Notes (Signed)
 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   68 year old male presents with weakness and paresthesias which been going on for quite some time.  Patient's had extensive evaluation including an evaluation at [facility] yesterday.  That visit was reviewed.  Patient is currently being followed by neurology.  He is scheduled to have an MRI of his brain in the near future.  Patient came in hoping to have it done sooner.  Denies any significant changes since he left the hospital last night       Prior to Admission medications   Medication Sig Start Date End Date Taking? Authorizing Provider  finasteride (PROSCAR) 5 MG tablet Take 5 mg by mouth daily.    [provider]  Multiple Vitamin (MULTIVITAMIN) capsule Take 1 capsule by mouth daily.    [provider]  multivitamin-lutein (OCUVITE-LUTEIN) CAPS capsule Take 1 capsule by mouth daily.    [provider]  Rosuvastatin Calcium 10 MG CPSP Take 20 mg by mouth daily.    [provider]    Allergies: Vancomycin     Review of Systems  All other systems reviewed and are negative.   Updated Vital Signs BP (!) 152/91 (BP Location: Right Arm)   Pulse (!) 104   Temp 98.6 F (37 C) (Oral)   Resp 20   Ht 1.88 m (6' 2)   Wt 95.3 kg   SpO2 97%   BMI 26.96 kg/m   Physical Exam Vitals and nursing note reviewed.  Constitutional:      General: He is not in acute distress.    Appearance: Normal appearance. He is well-developed. He is not toxic-appearing.  HENT:     Head: Normocephalic and atraumatic.  Eyes:     General: Lids are normal.     Conjunctiva/sclera: Conjunctivae normal.     Pupils: Pupils are equal, round, and reactive to light.  Neck:     Thyroid : No thyroid  mass.     Trachea: No tracheal deviation.  Cardiovascular:     Rate  and Rhythm: Normal rate and regular rhythm.     Heart sounds: Normal heart sounds. No murmur heard.    No gallop.  Pulmonary:     Effort: Pulmonary effort is normal. No respiratory distress.     Breath sounds: Normal breath sounds. No stridor. No decreased breath sounds, wheezing, rhonchi or rales.  Abdominal:     General: There is no distension.     Palpations: Abdomen is soft.     Tenderness: There is no abdominal tenderness. There is no rebound.  Musculoskeletal:        General: No tenderness. Normal range of motion.     Cervical back: Normal range of motion and neck supple.  Skin:    General: Skin is warm and dry.     Findings: No abrasion or rash.  Neurological:     General: No focal deficit present.     Mental Status: He is alert and oriented to person, place, and time. Mental status is at baseline.     GCS: GCS eye subscore is 4. GCS verbal subscore is 5. GCS motor subscore is 6.     Cranial Nerves: No cranial nerve deficit.     Sensory: No sensory deficit.     Motor: Motor function is intact.  Gait: Gait is intact.  Psychiatric:        Attention and Perception: Attention normal.        Speech: Speech normal.        Behavior: Behavior normal.     (all labs ordered are listed, but only abnormal results are displayed) Labs Reviewed - No data to display  EKG: None  Radiology: NCV with EMG(electromyography) Result Date: 07/11/2024 Tobie Tonita POUR, DO     07/11/2024  3:54 PM [facility] [address] 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 NCV & EMG Findings: Extensive electrodiagnostic testing of the left upper and lower extremity shows: Left median, ulnar, radial,  mixed palmar, sural, and superficial peroneal sensory responses are within normal limits. Left median, ulnar, and tibial motor responses are within normal limits.  Left peroneal motor response at the extensor digitorum brevis is reduced, and normal at the tibialis anterior, which is normal for patient's age. Left tibial H reflex study is within normal limits.  Left tibial F and ulnar F-wave studies are within normal limits. There is no evidence of active or chronic motor axonal loss changes affecting any of the tested muscles, including cervical and lumbar paraspinal muscles.  Motor unit configuration and recruitment pattern is within normal limits. Impression: This is a normal study of the left upper and lower extremities.  In particular, there is no evidence of a large fiber sensorimotor polyneuropathy, cervical/lumbosacral radiculopathy, or carpal tunnel syndrome. ___________________________ Tonita Tobie, DO Nerve Conduction Studies  Stim Site NR Peak (ms) Norm Peak (ms) O-P Amp (V) Norm O-P Amp Left Median Anti Sensory (2nd Digit)  32 C Wrist    3.1 <3.8 23.2 >10 Left Radial Anti Sensory (Base 1st Digit)  32 C Wrist    2.3 <2.8 19.5 >10 Left Sup Peroneal Anti Sensory (Ant Lat Mall)  32 C 12 cm    2.9 <4.6 9.4 >3 Left Sural Anti Sensory (Lat Mall)  32 C Calf    3.2 <4.6 10.4 >3 Left Ulnar Anti Sensory (5th Digit)  32 C Wrist    3.1 <3.2 13.9 >5  Stim Site NR Onset (ms) Norm Onset (ms) O-P Amp (mV) Norm O-P Amp Site1 Site2 Delta-0 (ms) Dist (cm) Vel (m/s) Norm Vel (m/s) Left Median Motor (Abd Poll Brev)  32 C Wrist    2.9 <4.0 9.2 >5 Elbow Wrist 6.1 33.0 54 >50 Elbow    9.0  8.7        Left Peroneal Motor (Ext Dig Brev)  32 C Ankle    4.6 <6.0 *0.7 >2.5 B Fib Ankle 9.2 38.0 41 >40 B Fib    13.8  0.6  Poplt B Fib 2.2 10.0 45 >40 Poplt    16.0  0.5        Left Peroneal TA Motor (Tib Ant)  32 C Fib Head    2.8 <4.5 5.3 >3 Poplit Fib Head 2.0 10.0 50 >40 Poplit    4.8 <5.7 5.2        Left Tibial Motor (Abd  Hall Brev)  32 C Ankle    5.7 <6.0 6.2 >4 Knee Ankle 10.6 44.0 42 >40 Knee    16.3  4.0  Left Ulnar Motor (Abd Dig Minimi)  32 C Wrist    2.7 <3.1 12.6 >7 B Elbow Wrist 4.2 24.0 57 >50 B Elbow    6.9  12.1  A Elbow B Elbow 2.0 10.0 50 >50 A Elbow    8.9  11.4         Stim Site NR Peak (ms) Norm Peak (ms) P-T Amp (V) Site1 Site2 Delta-P (ms) Norm Delta (ms) Left Median/Ulnar Palm Comparison (Wrist - 8cm)  32 C Median Palm    1.8 <2.2 65.8 Median Palm Ulnar Palm 0.0  Ulnar Palm    1.8 <2.2 14.9     Electromyography  Side Muscle Ins.Act Fibs Fasc Recrt Amp Dur Poly Activation Comment Left 1stDorInt Nml Nml Nml Nml Nml Nml Nml Nml N/A Left Abd Poll Brev Nml Nml Nml Nml Nml Nml Nml Nml N/A Left PronatorTeres Nml Nml Nml Nml Nml Nml Nml Nml N/A Left Biceps Nml Nml Nml Nml Nml Nml Nml Nml N/A Left Triceps Nml Nml Nml Nml Nml Nml Nml Nml N/A Left Deltoid Nml Nml Nml Nml Nml Nml Nml Nml N/A Left AntTibialis Nml Nml Nml Nml Nml Nml Nml Nml N/A Left Gastroc Nml Nml Nml Nml Nml Nml Nml Nml N/A Left Flex Dig Long Nml Nml Nml Nml Nml Nml Nml Nml N/A Left BicepsFemS Nml Nml Nml Nml Nml Nml Nml Nml N/A Left RectFemoris Nml Nml Nml Nml Nml Nml Nml Nml N/A Left GluteusMed Nml Nml Nml Nml Nml Nml Nml Nml N/A Left Cervical Parasp Low Nml Nml Nml Nml *- *- *- *N.E. N/A Left Lumbo Parasp Low Nml Nml Nml Nml *- *- *- *N.E. N/A Left Infraspinatus Nml Nml Nml Nml Nml Nml Nml Nml N/A Waveforms:                            Procedures   Medications Ordered in the ED - No data to display                                  Medical Decision Making  Informed patient unfortunately did not have MRI availability here.  Informed him that this is available at [facility] and states he wants to go there.     Final diagnoses:  Paresthesias    ED Discharge Orders     None          Dasie Faden, MD 07/13/24 1042

## 2024-07-13 NOTE — ED Provider Notes (Signed)
 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   Patient presents today requesting an MRI. Reports that he has been having numbness in his 1st and 2nd toes for the past year and a half, he saw neurosurgery for the symptoms and was told to continue monitoring his symptoms and follow back up if he developed any worsening symptoms or weakness.  There have been no significant changes in those symptoms since onset.  About 6 months ago, he developed numbness in his thumb and index finger on both of his hands.  Does report that he has dropped things.  This has been persistent since onset as well.  Reports that 1 month ago he started to feel some weakness in his upper legs.  Reports that they feel tired. No objective weakness has been noted and he continues to be able to walk unassisted.  He was seen at that time and had an MRI of his cervical spine that did not show significant changes, he also had NCS/EMG that showed radiculopathy in his cervical spine. Apparently there was no compressive pathology causing symptoms and therefore he was referred to neurology. In the past few weeks he has developed pain in his bilateral inner thighs. 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 He also had an MRI of his lumbar spine that showed mild degenerative changes.  Reports that he was concerned about CIDP and therefore started taking prednisone outpatient to see if this would help but has not had any significant improvement.  He had NCS/EMG testing on 10/31 that was normal.  Dr. Tobie did notice on 10/28 that he had brisk reflexes and therefore recommended MRI of his thoracic spine with and without contrast.  Last night he had 1 episode of urinary incontinence which was unusual and  concerning for him, he went to [facility] and was told that he had good rectal tone on exam and did not have concerning symptoms to warrant emergent MRI and was therefore discharged with recommendations for outpatient imaging.  Presents here hoping to expedite his workup and have MRIs performed.   The history is provided by the patient. No language interpreter was used.       Prior to Admission medications   Medication Sig Start Date End Date Taking? Authorizing Provider  finasteride (PROSCAR) 5 MG tablet Take 5 mg by mouth daily.    [provider]  Multiple Vitamin (MULTIVITAMIN) capsule Take 1 capsule by mouth daily.    [provider]  multivitamin-lutein (OCUVITE-LUTEIN) CAPS capsule Take 1 capsule by mouth daily.    [provider]  Rosuvastatin Calcium 10 MG CPSP Take 20 mg by mouth daily.    [provider]    Allergies: Vancomycin     Review of Systems  All other systems reviewed and are negative.   Updated Vital Signs BP (!) 155/78   Pulse 63   Temp 98.2 F (36.8 C)   Resp 18   SpO2 97%   Physical Exam Vitals and nursing note reviewed.  Constitutional:      General: He is not in acute distress.    Appearance: Normal appearance. He is normal weight. He is not ill-appearing, toxic-appearing or diaphoretic.  HENT:  Head: Normocephalic and atraumatic.  Cardiovascular:     Rate and Rhythm: Normal rate.  Pulmonary:     Effort: Pulmonary effort is normal. No respiratory distress.  Musculoskeletal:        General: Normal range of motion.     Cervical back: Normal range of motion.  Skin:    General: Skin is warm and dry.  Neurological:     General: No focal deficit present.     Mental Status: He is alert and oriented to person, place, and time.     GCS: GCS eye subscore is 4. GCS verbal subscore is 5. GCS motor subscore is 6.     Sensory: Sensation is intact.     Motor: Motor function is intact.     Coordination: Coordination is  intact.     Gait: Gait is intact.     Comments: Alert and oriented to self, place, time and event.    Speech is fluent, clear without dysarthria or dysphasia.    Strength 5/5 in upper/lower extremities   Sensation intact in upper/lower extremities other than numbness noted to 2nd and 3rd fingers and 1st and 2nd toes   CN I not tested  CN II grossly intact visual fields bilaterally. Did not visualize posterior eye.  CN III, IV, VI PERRLA and EOMs intact bilaterally  CN V Intact sensation to sharp and light touch to the face  CN VII facial movements symmetric  CN VIII not tested  CN IX, X no uvula deviation, symmetric rise of soft palate  CN XI 5/5 SCM and trapezius strength bilaterally  CN XII Midline tongue protrusion, symmetric L/R movements   Psychiatric:        Mood and Affect: Mood normal.        Behavior: Behavior normal.     (all labs ordered are listed, but only abnormal results are displayed) Labs Reviewed - No data to display  EKG: None  Radiology: No results found.   Procedures   Medications Ordered in the ED  LORazepam (ATIVAN) tablet 0.5 mg (0.5 mg Oral Given 07/13/24 1343)                                    Medical Decision Making Amount and/or Complexity of Data Reviewed Radiology: ordered.  Risk Prescription drug management.   This patient is a 68 y.o. male who presents to the ED for concern of numbness, weakness, urinary incontinence, this involves an extensive number of treatment options, and is a complaint that carries with it a high risk of complications and morbidity. The emergent differential diagnosis prior to evaluation includes, but is not limited to,  CVA, spinal cord injury, ACS, neuromuscular disorder, polyneuropathy, myositis  This is not an exhaustive differential.   Past Medical History / Co-morbidities / Social History:  has a past medical history of Adenomatous colon polyp, Allergic rhinitis, Benign neoplasm of colon, C. 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  Additional history: Chart reviewed. Pertinent results include: last saw Dr. Tobie in the office on 10/31, had normal NCS/EMG, plan was for outpatient t spine MRI with and without contrast. Had LP on  10/27 that showed elevated protein at 49, otherwise normal. Has had overall normal MRI C spine (9/25) and l spine without contrast as well as normal head CT on 10/27  Physical Exam: Physical exam performed. The pertinent findings include:   Alert and oriented to self, place, time and event.    Speech is fluent, clear without dysarthria or dysphasia.    Strength 5/5 in upper/lower extremities   Sensation intact in upper/lower extremities other than numbness noted to 2nd and 3rd fingers and 1st and 2nd toes   CN I not tested  CN II grossly intact visual fields bilaterally. Did not visualize posterior eye.  CN III, IV, VI PERRLA and EOMs intact bilaterally  CN V Intact sensation to sharp and light touch to the face  CN VII facial movements symmetric  CN VIII not tested  CN IX, X no uvula deviation, symmetric rise of soft palate  CN XI 5/5 SCM and trapezius strength bilaterally  CN XII Midline tongue protrusion, symmetric L/R movements    Imaging Studies: I ordered imaging studies including MRI brain, c spine, t spine, l spine with and without contrast.   Results are pending at shift change  Disposition:  Patients MRIs are pending at shift change. If overall negative for acute findings, suspect patient can be discharged with close  outpatient neurology follow-up.   Care handoff to Margit Paris, PA-C at shift change. Please see their note for continued evaluation and disposition   This is a shared visit with supervising physician Dr. Levander who has independently evaluated patient & provided guidance in evaluation/management/disposition, in agreement with care   Final diagnoses:  Hyperreflexia    ED Discharge Orders     None          Nora Lauraine DELENA DEVONNA 07/15/24 1700    Levander [place], MD 07/22/24 1333

## 2024-07-13 NOTE — ED Triage Notes (Signed)
 Patient presents requesting MRI. Patient has numbness that has been ongoing for 1.5 years, follows-up closely with Dr. Tobie. Patient was seen at [facility] last night, states the MRI was ordered and then had been cancelled. Reports being discharged with instructions to follow-up.

## 2024-07-13 NOTE — ED Provider Notes (Signed)
  Physical Exam  BP (!) 155/78   Pulse 63   Temp 98.2 F (36.8 C)   Resp 18   SpO2 97%   Physical Exam  Procedures  Procedures  ED Course / MDM    Medical Decision Making Amount and/or Complexity of Data Reviewed Radiology: ordered.  Risk Prescription drug management.   Here for MRI Numbness in bilateral index and middle fingers and toes x 1 year Bilateral LE weakness now Urinary incontinence D/ch from [facility] yesterday after good rectal tone MRI brain, C-, t-, L- spines all w/CM  17:15 - patient returns from MRI. Introduced myself and updated as to waiting on radiology read of his MRI studies. He denies any needs at this time.   MRI brain:  IMPRESSION: 1. Unremarkable appearance of the brain for age.  MRI c-spine:  IMPRESSION: 1. Normal cervical spinal cord signal. 2. Diffuse cervical disc and facet degeneration, overall mildly progressed from 2017 with multilevel neural foraminal stenosis as above. No more than mild spinal stenosis.  MRI t-spine  IMPRESSION: 1. Normal appearance of the thoracic spinal cord. 2. Mild thoracic spondylosis without stenosis.  MRI l-spine:   IMPRESSION: 1. Diffuse enhancement of the right S1 nerve root without nodularity, potentially reflecting radiculitis. 2. Unchanged multilevel disc degeneration without compressive stenosis. 3. Unchanged acute-appearing L3 superior endplate Schmorl's node with surrounding edema.  Patient was updated on results and plan for discharge. All questions addressed. He is appreciative of having the studies done here and will follow up with neurology in the outpatient setting.    Odell Balls, PA-C 07/13/24 1851    Levander [place], MD 07/15/24 [phone]

## 2024-07-13 NOTE — Discharge Instructions (Signed)
 As we discussed, your MRI studies do not show evidence of MS or other abnormality that warrants admission today. You can be discharged home and are encouraged to continue neurology care in the outpatient setting.   Return to the ED with any new or concerning symptoms at any time.

## 2024-07-13 NOTE — Discharge Instructions (Signed)
 We unfortunately do not have MRI here.  Please go to [facility]

## 2024-07-13 NOTE — ED Triage Notes (Signed)
 Patient has bilateral pain and weakness in legs. Neurologist wants patient to get a MRI of his spine and brain to rule out tumor. He reports that pain has been going on for the past week, weakness for about a month and numbness for the past year.

## 2024-07-14 ENCOUNTER — Encounter: Payer: Self-pay | Admitting: Neurology

## 2024-07-14 ENCOUNTER — Other Ambulatory Visit

## 2024-07-14 ENCOUNTER — Telehealth: Payer: Self-pay | Admitting: Neurology

## 2024-07-14 ENCOUNTER — Ambulatory Visit: Admitting: Neurology

## 2024-07-14 VITALS — BP 128/80 | HR 63 | Ht 74.0 in | Wt 211.0 lb

## 2024-07-14 DIAGNOSIS — R200 Anesthesia of skin: Secondary | ICD-10-CM

## 2024-07-14 DIAGNOSIS — R202 Paresthesia of skin: Secondary | ICD-10-CM | POA: Diagnosis not present

## 2024-07-14 LAB — ARBOVIRUS IGG, CSF
California Enceph IgG: <1:1 {titer}
Eastern eq encephalitis, IgG: <1:1 {titer}
St Louis encephalitis, IgG: <1:1 {titer}
West Nile IgG CSF: 0.1 IV (ref <1.1)
Western eq encephalitis, IgG: <1:1 {titer}

## 2024-07-14 NOTE — Telephone Encounter (Signed)
 Called and spoke to [place] and she was inquiring about patients EMG. I informed Olam we just completed the EMG on Friday and I have just faxed over results for their review. Informed Olam that this is a referral for a second opinion. Olam verbalized understanding and had no further questions.

## 2024-07-14 NOTE — Telephone Encounter (Signed)
  Called [facility] Olam  [phone]. Left a message for a call back.

## 2024-07-14 NOTE — Telephone Encounter (Signed)
 Patient called to discuss ER follow up and schedule.

## 2024-07-14 NOTE — Telephone Encounter (Signed)
 Pt called and LM on machine- wants to speak with patel-wants a call to discuss an er f/u

## 2024-07-14 NOTE — Progress Notes (Signed)
 Follow-up Visit   Date: 07/14/2024    Martin [name] MRN: [id number] DOB: 17-Feb-1956    Martin [name] is a 68 y.o. Caucasian male with hyperlipidemia returning to the clinic for follow-up of paresthesias of the hands and feet.  The patient was accompanied to the clinic by self.  IMPRESSION/PLAN: Bilateral hand and feet paresthesias.  He has had extensive testing thus far including MRI of the neuraxis, NCS/EMG, serology testing, and CSF testing.  Patient was reassured that there is no CNS pathology, such as multiple sclerosis or cord disease.  NCS/EMG was normal and did not show evidence of neuropathy or CIDP.  He was asking of small fiber neuropathy could explain his symptoms and I would expect him to have more painful paresthesias than numbness.  I explained that Morton's neuroma could explain his numbness between the first web space.  Patient was reassured that with his testing returning normal, many worrisome conditions have been excluded.   He has a second opinion scheduled with [facility] on December 16th.    --------------------------------------------- History of present illness: Starting early 2024, began having numbness in the 1st and 2nd toes.  He saw neurosurgery and was advised to monitor symptoms.  There has been no significant change in the numbness of the toes over the past year.  Over the past month, he has been having fatigue in the legs and reports that they feel tired.  He denies weakness or imbalance.    In spring 2025, began having numbness in the thumb and index finger of both hands.  Symptoms are constant.  He gets fatigued more easily and has cramps in the fingers.  He is dropping things.    He saw Dr. Joshua for these symptoms and had MRI cervical spine which showed mild cervical spondylosis and moderate left C5 foraminal stenosis.  NCS/EMG performed at [facility] showed multilevel radiculopathies affecting C5-C8 with active denervation at  C7 bilaterally.  Given that there is no compressive pathology on his imaging to correspond to his EMG findings, he is referred to neurology for further evaluation.   UPDATE 07/08/2024:  Since he was last seen in the office on 10/13, he reports having new pain and cramping in the inner thighs and ongoing weakness in the legs.  Numbness of the feet and hands is unchanged. He went to the ER yesterday for expedite CSF testing due to concern of CIDP which shows normal cell count and mildly elevated CSF protein 49.  IVIG was offered, however due to concern that insurance may not cover the cost, he opted not to proceed.  MRI lumbar spine shows mild degenerative changes, no compressive nerve impingement.    He had extra prednisone which was prescribed to his dog and reports taking prednisone 40mg  x 2 days and 100mg  last night to see if this would help.  He is worried about losing leg strength and requesting direction admission for IVIG.  He would like to start empiric therapy for CIDP.  UPDATE 07/14/2024:  He is here for acute visit to review his ER visits from the weekend.  He went to [facility] ER with one spell of urinary incontinence and was hoping to get expedited MRI brain and thoracic spine, however, neurology did not feel imaging was indicated and recommended out-patient follow-up.  The following day, he went to [facility], but did they not have mobile MRI unit and thus, he went to [facility] ER where contrasted  imaging of the entire neuroaxis was completed.  There was no evidence of multiple sclerosis or cord pathology.  MRI lumbar spine showed right S1 nerve root enhancement.  He denies radicular right leg pain.  He is here to review the above.    Medications:  Current Outpatient Medications on File Prior to Visit  Medication Sig Dispense Refill   finasteride (PROSCAR) 5 MG tablet Take 5 mg by mouth daily.     Multiple Vitamin (MULTIVITAMIN) capsule Take 1 capsule by mouth daily.      multivitamin-lutein (OCUVITE-LUTEIN) CAPS capsule Take 1 capsule by mouth daily.     Rosuvastatin Calcium 10 MG CPSP Take 20 mg by mouth daily.     No current facility-administered medications on file prior to visit.    Allergies:  Allergies  Allergen Reactions   Vancomycin      REACTION: neutropenia    Vital Signs:  BP 128/80   Pulse 63   Ht 6' 2 (1.88 m)   Wt 211 lb (95.7 kg)   SpO2 98%   BMI 27.09 kg/m   Neurological Exam: MENTAL STATUS including orientation to time, place, person, recent and remote memory, attention span and concentration, language, and fund of knowledge is normal.  Speech is not dysarthric.  CRANIAL NERVES:   Pupils equal round and reactive to light.  Normal conjugate, extra-ocular eye movements in all directions of gaze.  No ptosis.  Face is symmetric. Palate elevates symmetrically.  Tongue is midline.  No fasciculations.   MOTOR:  No atrophy, fasciculations or abnormal movements.  No pronator drift.   Upper Extremity:  Right  Left  Deltoid  5/5   5/5   Biceps  5/5   5/5   Triceps  5/5   5/5   Finger extensors  5/5   5/5   Finger flexors  5/5   5/5   Dorsal interossei  5/5   5/5   Abductor pollicis  5/5   5/5   Tone (Ashworth scale)  0  0   Lower Extremity:  Right  Left  Hip flexors  5/5   5-/5   Hip extensors  5/5   5/5   Knee flexors  5/5   5/5   Knee extensors  5/5   5/5   Dorsiflexors  5/5   5/5   Plantarflexors  5/5   5/5   Toe extensors  5/5   5/5   Toe flexors  5/5   5/5   Tone (Ashworth scale)  0  0     MSRs:                                           Right        Left brachioradialis 2+  2+  biceps 2+  2+  triceps 2+  2+  patellar 3+  3+  ankle jerk 2+  2+  plantar response down  down   SENSORY:  Intact to vibration throughout except mildly diminished at the left great toe.  Temperature is intact throughout.    COORDINATION/GAIT:  Normal finger-to- nose-finger.  Intact rapid alternating movements bilaterally.  He is able  to stand up from chair without using arms to push off.  Gait narrow based and stable.  Data: MRI brain wwo contrast 07/13/2024:  Unremarkable  MRI lumbar spine wwo contrast 07/13/2024: 1. Diffuse enhancement of the right S1 nerve  root without nodularity, potentially reflecting radiculitis. 2. Unchanged multilevel disc degeneration without compressive stenosis. 3. Unchanged acute-appearing L3 superior endplate Schmorl's node with surrounding edema.  MRI cervical spine wwo contrast 07/13/2024: 1. Normal cervical spinal cord signal. 2. Diffuse cervical disc and facet degeneration, overall mildly progressed from 2017 with multilevel neural foraminal stenosis as above. No more than mild spinal stenosis.  MRI thoracic spine wwo contrast 07/13/2024: 1. Normal appearance of the thoracic spinal cord. 2. Mild thoracic spondylosis without stenosis.  NCS/EMG of the arms 05/26/2024:  Evidence of multilevel cervical radiculopathy affecting bilateral C5-C8 nerve root, evidence of active denervation potentially affecting bilateral C7 nerve roots.    MRI cervical spine 06/05/2024: Multilevel cervical spondylosis without significant spinal stenosis or overt neural impingement.  Multifactorial degenerative changes with resultant moderate left C5 foraminal stenosis, with mild left C7 foraminal narrowing. Moderate left sided facet hypertrophy at C2-3 with associated mild reactive marrow edema.  Findings could contribute to neck pain.    MRI lumbar spine wo 07/04/2024: 1. Acute Schmorl's node involving the superior endplate of L3 posterolaterally on the right with moderate edema. 2. Mild, diffuse disc bulging at L2-3 and L3-4 resulting in mild bilateral lateral recess stenosis at L2-3 and mild central spinal canal stenosis and mild bilateral lateral recess stenosis at L3-4. No apparent nerve root impingement at either level.   CSF 07/07/2024:  R11 W0 P49* G76    Cryptococcal antigen neg, HSV neg, meningitis  panel neg  Labs 06/23/2024:  vitamin B12 689, folate >24, copper 100, SPEP with IFE no M protein  Total time spent reviewing records, interview, history/exam, documentation, and coordination of care on day of encounter:  30 minutes    Thank you for allowing me to participate in patient's care.  If I can answer any additional questions, I would be pleased to do so.    Sincerely,    Mikolaj Woolstenhulme K. Tobie, DO

## 2024-07-14 NOTE — Telephone Encounter (Signed)
 Pt called and LM with AN. He wants to speak with Dr.patel. he is having worsening symptoms. Leg pain and urinary incontinence.   Pt states he is under evaluation for possible spinal tumor or MS. He has had leg weakness for one month and upper thigh pain. 2 weeks worse over the last 3 days with +CDF on spinal tap. He states the leg pain and weakness was worse last night with one episode of urinary incontinence. He went to [facility] ED and MRI was ordered by ED but training neuro canceled the study and told him to f/u. He wants to know what to do

## 2024-07-14 NOTE — Progress Notes (Signed)
 EMG results. Faxed.

## 2024-07-14 NOTE — Telephone Encounter (Signed)
 [facility] cld trying to get clarification on referral sent as its missing info [id number]  option 2

## 2024-07-15 ENCOUNTER — Ambulatory Visit: Payer: Self-pay | Admitting: Neurology

## 2024-07-15 LAB — GANGLIOSIDE ABS, IGG/IGM
GD1b Antibody, IgG: 4 IV (ref 0–50)
GD1b Antibody, IgM: 3 IV (ref 0–50)
GM1 Antibody, IgG: 3 IV (ref 0–50)
GM1 Antibody, IgM: 9 IV (ref 0–50)
GQ1b Antibody, IgG: 3 IV (ref 0–50)
GQ1b Antibody, IgM: 3 IV (ref 0–50)

## 2024-07-21 ENCOUNTER — Other Ambulatory Visit

## 2024-07-29 ENCOUNTER — Encounter: Payer: Self-pay | Admitting: Oncology

## 2024-09-08 ENCOUNTER — Encounter: Payer: Self-pay | Admitting: *Deleted
